# Patient Record
Sex: Female | Born: 1970 | Hispanic: Yes | Marital: Married | Smoking: Never smoker
Health system: Southern US, Community
[De-identification: ages and names within clinical notes are randomized; demographics above are authoritative.]

## PROBLEM LIST (undated history)

## (undated) DIAGNOSIS — I10 Essential (primary) hypertension: Secondary | ICD-10-CM

## (undated) DIAGNOSIS — N6459 Other signs and symptoms in breast: Secondary | ICD-10-CM

## (undated) DIAGNOSIS — K219 Gastro-esophageal reflux disease without esophagitis: Secondary | ICD-10-CM

## (undated) HISTORY — DX: Gastro-esophageal reflux disease without esophagitis: K21.9

## (undated) HISTORY — PX: TUBAL LIGATION: SHX77

---

## 1992-11-08 HISTORY — PX: ECTOPIC PREGNANCY SURGERY: SHX613

## 2005-02-04 ENCOUNTER — Ambulatory Visit (HOSPITAL_COMMUNITY): Admission: RE | Admit: 2005-02-04 | Discharge: 2005-02-04 | Payer: Self-pay | Admitting: Gynecology

## 2006-07-31 ENCOUNTER — Ambulatory Visit (HOSPITAL_COMMUNITY): Admission: RE | Admit: 2006-07-31 | Discharge: 2006-07-31 | Payer: Self-pay | Admitting: Gynecology

## 2006-11-07 ENCOUNTER — Emergency Department (HOSPITAL_COMMUNITY): Admission: EM | Admit: 2006-11-07 | Discharge: 2006-11-07 | Payer: Self-pay | Admitting: Emergency Medicine

## 2007-04-18 ENCOUNTER — Inpatient Hospital Stay (HOSPITAL_COMMUNITY): Admission: AD | Admit: 2007-04-18 | Discharge: 2007-04-18 | Payer: Self-pay | Admitting: Family Medicine

## 2007-05-11 ENCOUNTER — Inpatient Hospital Stay (HOSPITAL_COMMUNITY): Admission: AD | Admit: 2007-05-11 | Discharge: 2007-05-11 | Payer: Self-pay | Admitting: Gynecology

## 2007-07-12 ENCOUNTER — Ambulatory Visit (HOSPITAL_COMMUNITY): Admission: RE | Admit: 2007-07-12 | Discharge: 2007-07-12 | Payer: Self-pay | Admitting: Obstetrics & Gynecology

## 2007-11-21 ENCOUNTER — Inpatient Hospital Stay (HOSPITAL_COMMUNITY): Admission: AD | Admit: 2007-11-21 | Discharge: 2007-11-24 | Payer: Self-pay | Admitting: Obstetrics & Gynecology

## 2007-11-21 ENCOUNTER — Ambulatory Visit: Payer: Self-pay | Admitting: Obstetrics & Gynecology

## 2007-11-21 ENCOUNTER — Encounter: Payer: Self-pay | Admitting: Obstetrics & Gynecology

## 2007-11-29 ENCOUNTER — Inpatient Hospital Stay (HOSPITAL_COMMUNITY): Admission: AD | Admit: 2007-11-29 | Discharge: 2007-11-29 | Payer: Self-pay | Admitting: Obstetrics & Gynecology

## 2007-11-30 ENCOUNTER — Ambulatory Visit: Payer: Self-pay | Admitting: Obstetrics and Gynecology

## 2008-11-26 IMAGING — US US OB TRANSVAGINAL MODIFY
1 series · 14 of 28 positions shown · non-contrast
Comparison: none

CLINICAL DATA: Abdominal pain, early pregnancy.
 OBSTETRICAL ULTRASOUND <14 WKS AND TRANSVAGINAL OB US:
TECHNIQUE: Both transabdominal and transvaginal ultrasound examinations were performed for complete evaluation of the gestation as well as the maternal uterus, adnexal regions, and pelvic cul-de-sac.

[Series 1: us ob transvaginal modify · 0.32mm/px · 14 of 32 slices shown]
[im 2/32]
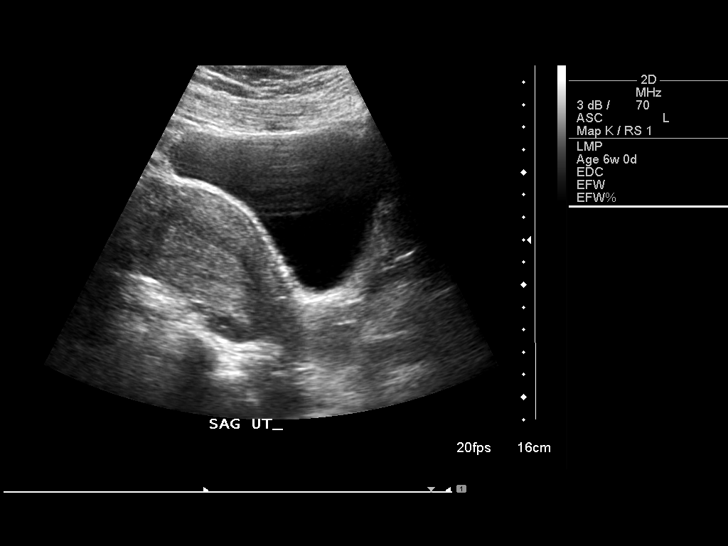
[im 4/32]
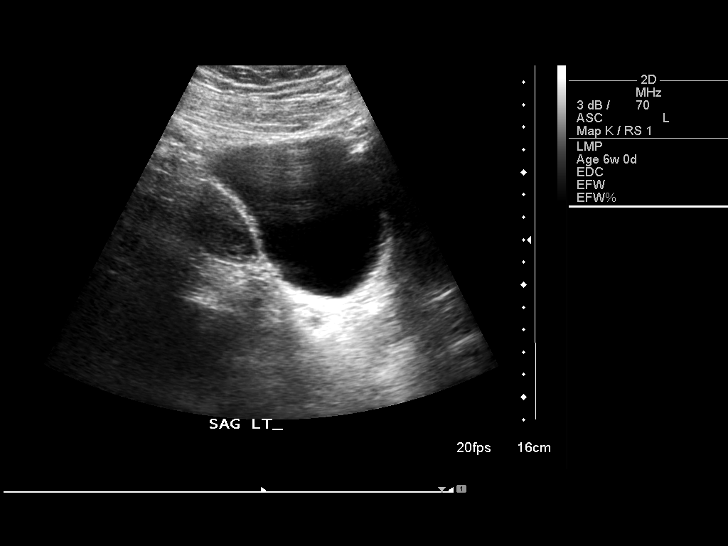
[im 6/32]
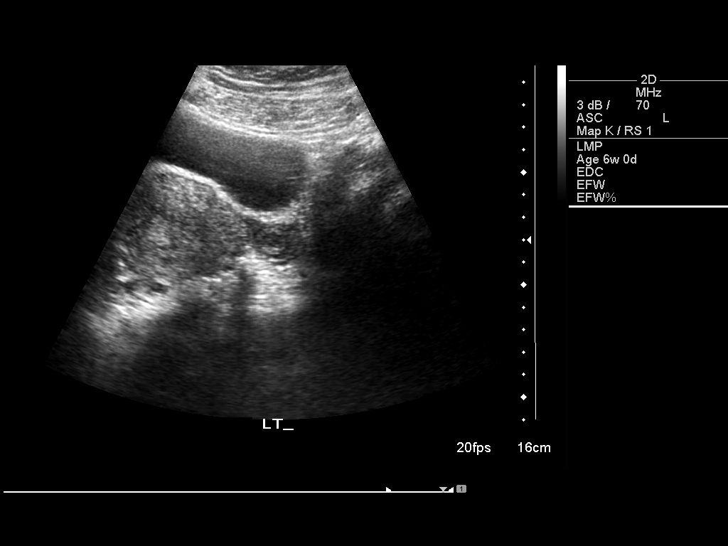
[im 9/32]
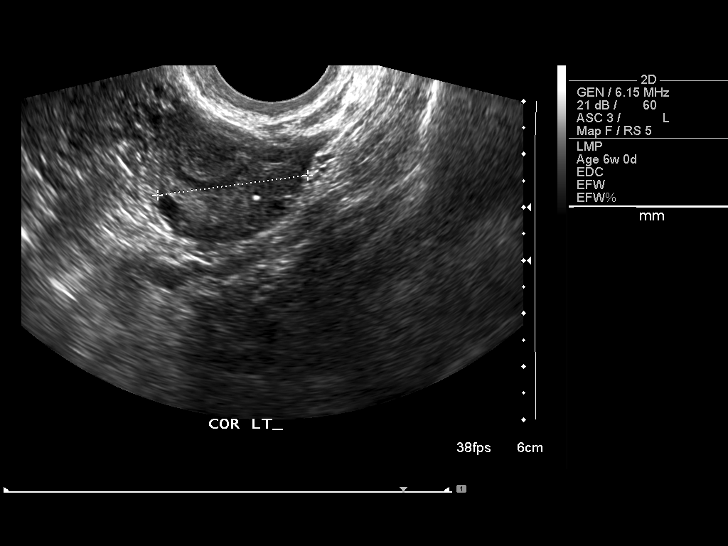
[im 11/32]
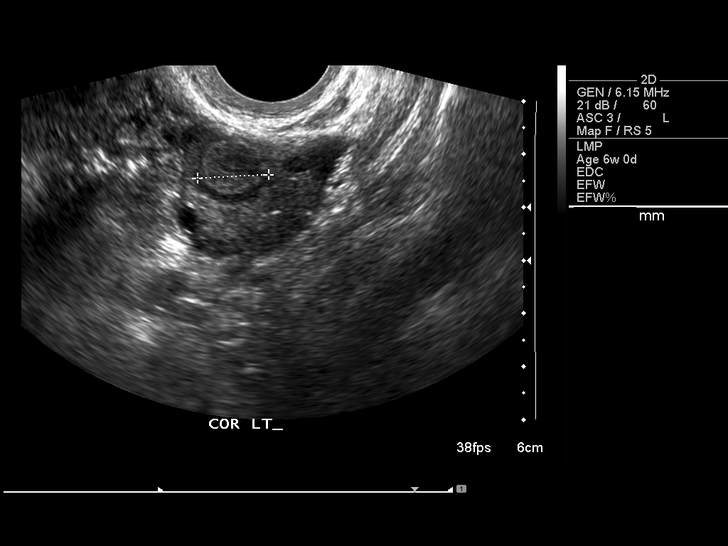
[im 13/32]
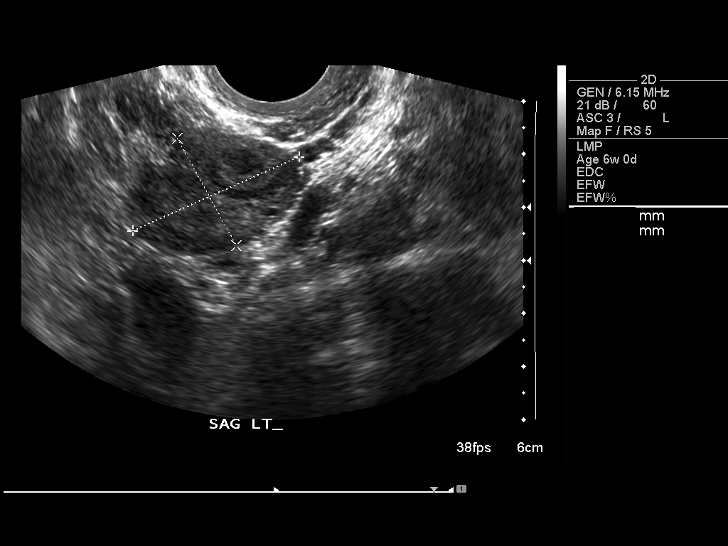
[im 15/32]
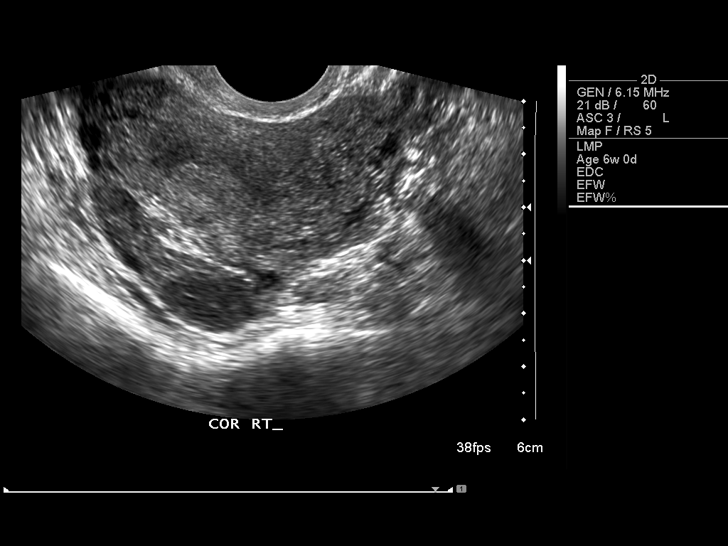
[im 18/32]
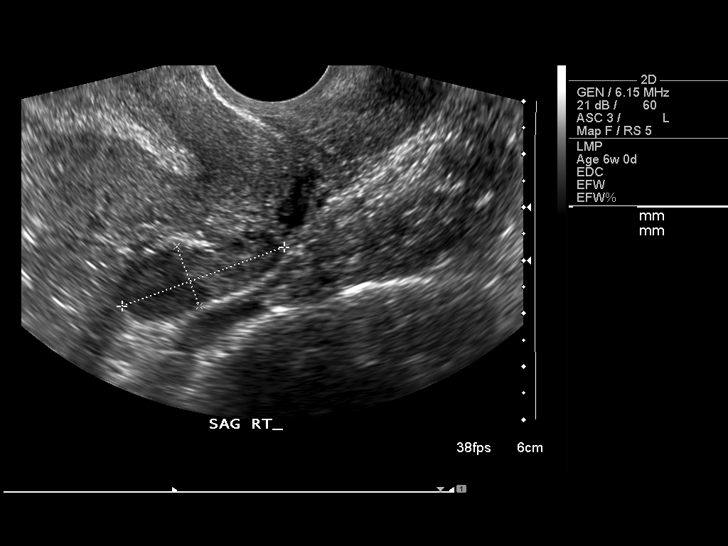
[im 20/32]
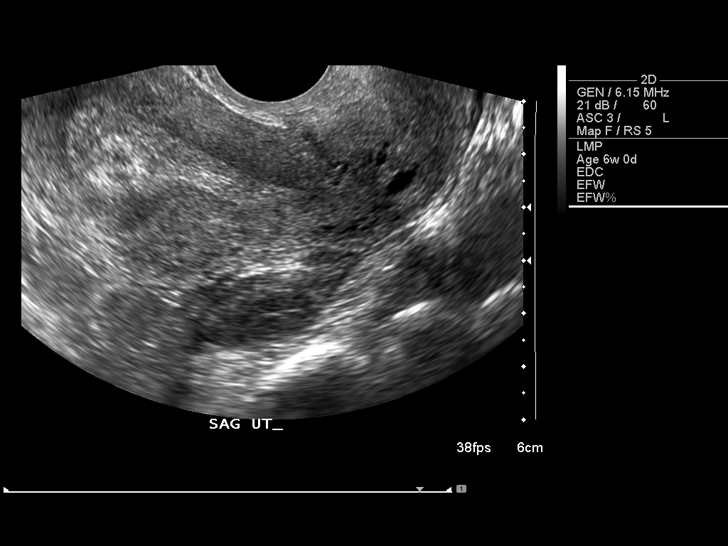
[im 22/32]
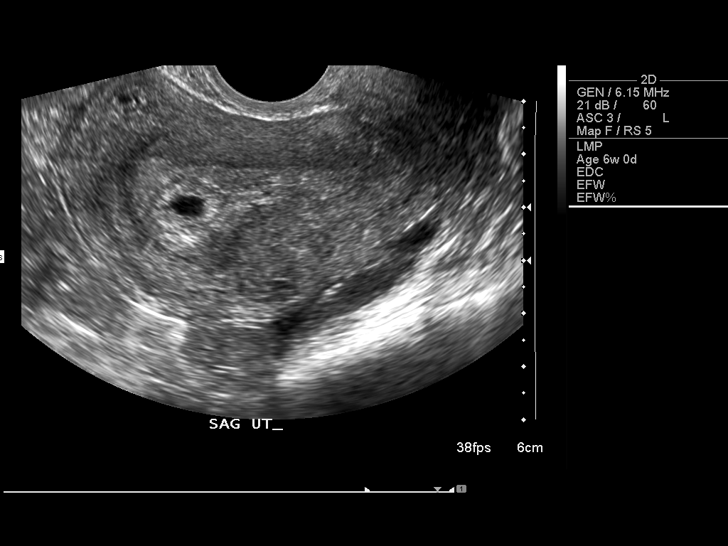
[im 25/32]
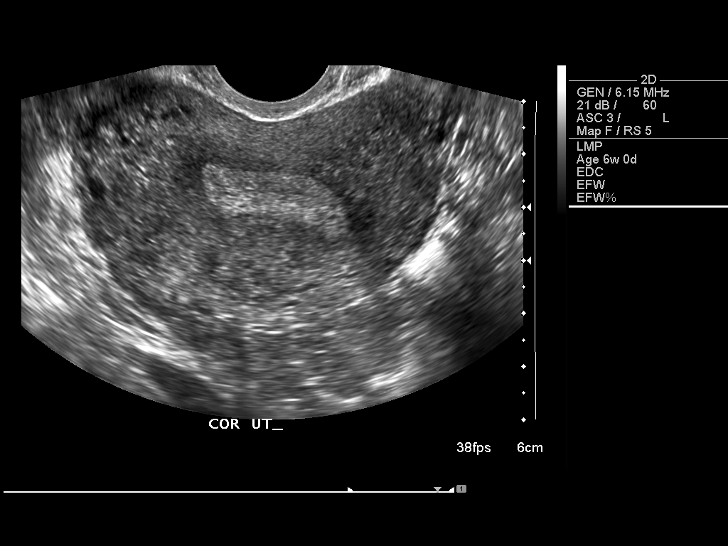
[im 27/32]
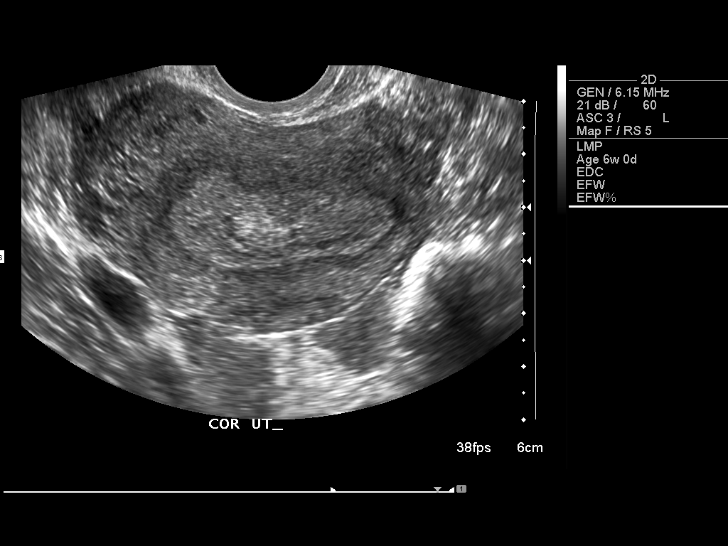
[im 29/32]
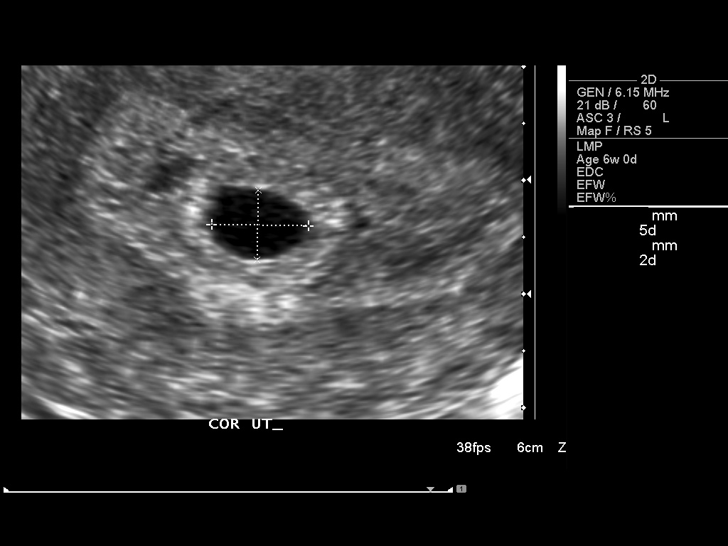
[im 32/32]
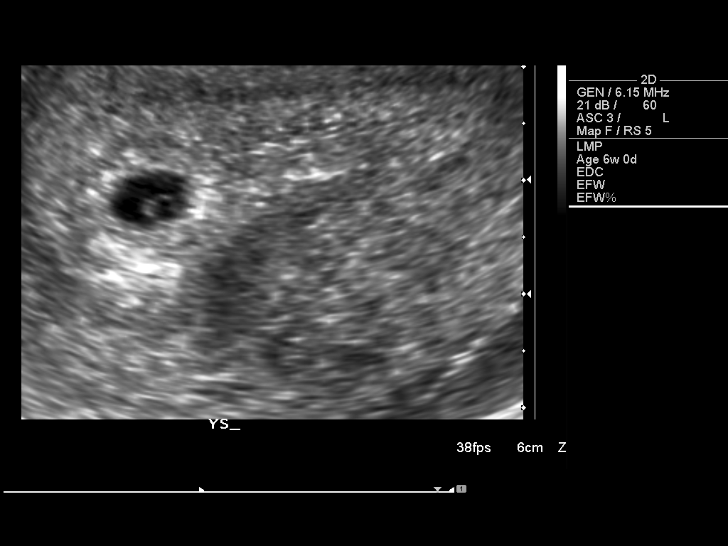

[14 of 28 positions shown; findings below may reference images not displayed]

FINDINGS: Gestational sac seen within uterus, containing a small yolk sac.  
 Fetal pole not identified.  
 Mean sac diameter 7.4 mm corresponding to gestational age of 5 weeks 3 days and an EDC of 12/16/07.
 No subchorionic hemorrhage or endometrial fluid.  
 Right ovary normal size and morphology, 2.0 x 3.3 x 1.2 cm.  
 Left ovary measures 3.4 x 2.3 x 2.9 cm and contains a small hemorrhagic corpus luteal cyst 1.3 cm diameter.  
 No adnexal masses or free pelvic fluid.
IMPRESSION: 1.  Early intrauterine gestation measuring 5 weeks 3 days estimated gestational age by mean sac diameter.  
 2.  Single yolk sac visualized without identification of fetal pole.  
 3.  Viability can be established by follow-up ultrasound in 7-10 days.

## 2010-03-13 ENCOUNTER — Ambulatory Visit: Payer: Self-pay | Admitting: Family Medicine

## 2010-04-08 ENCOUNTER — Inpatient Hospital Stay (HOSPITAL_COMMUNITY): Admission: AD | Admit: 2010-04-08 | Discharge: 2010-04-08 | Payer: Self-pay | Admitting: Obstetrics & Gynecology

## 2010-04-28 ENCOUNTER — Ambulatory Visit: Payer: Self-pay | Admitting: Obstetrics and Gynecology

## 2010-04-28 ENCOUNTER — Inpatient Hospital Stay (HOSPITAL_COMMUNITY): Admission: AD | Admit: 2010-04-28 | Discharge: 2010-04-28 | Payer: Self-pay | Admitting: Obstetrics & Gynecology

## 2010-07-31 ENCOUNTER — Ambulatory Visit (HOSPITAL_COMMUNITY)
Admission: RE | Admit: 2010-07-31 | Discharge: 2010-07-31 | Payer: Self-pay | Source: Home / Self Care | Admitting: Obstetrics & Gynecology

## 2010-09-21 ENCOUNTER — Ambulatory Visit (HOSPITAL_COMMUNITY): Admission: RE | Admit: 2010-09-21 | Discharge: 2010-09-21 | Payer: Self-pay | Admitting: Family Medicine

## 2010-10-30 ENCOUNTER — Inpatient Hospital Stay (HOSPITAL_COMMUNITY)
Admission: AD | Admit: 2010-10-30 | Discharge: 2010-10-30 | Payer: Self-pay | Source: Home / Self Care | Attending: Obstetrics and Gynecology | Admitting: Obstetrics and Gynecology

## 2010-11-01 ENCOUNTER — Inpatient Hospital Stay (HOSPITAL_COMMUNITY)
Admission: AD | Admit: 2010-11-01 | Discharge: 2010-11-03 | Payer: Self-pay | Source: Home / Self Care | Attending: Obstetrics & Gynecology | Admitting: Obstetrics & Gynecology

## 2010-11-05 ENCOUNTER — Ambulatory Visit
Admission: RE | Admit: 2010-11-05 | Discharge: 2010-11-05 | Payer: Self-pay | Source: Home / Self Care | Attending: Obstetrics and Gynecology | Admitting: Obstetrics and Gynecology

## 2010-11-05 ENCOUNTER — Encounter: Payer: Self-pay | Admitting: Obstetrics & Gynecology

## 2010-11-05 LAB — CONVERTED CEMR LAB
ALT: 28 units/L (ref 0–35)
AST: 32 units/L (ref 0–37)
Albumin: 3.7 g/dL (ref 3.5–5.2)
Alkaline Phosphatase: 169 units/L — ABNORMAL HIGH (ref 39–117)
BUN: 12 mg/dL (ref 6–23)
CO2: 20 meq/L (ref 19–32)
Calcium: 9.2 mg/dL (ref 8.4–10.5)
Chloride: 105 meq/L (ref 96–112)
Collection Interval-CRCL: 24 hr
Creatinine 24 HR UR: 1945 mg/24hr — ABNORMAL HIGH (ref 700–1800)
Creatinine Clearance: 287 mL/min — ABNORMAL HIGH (ref 75–115)
Creatinine, Ser: 0.47 mg/dL (ref 0.40–1.20)
Creatinine, Urine: 36 mg/dL
Glucose, Bld: 71 mg/dL (ref 70–99)
HCT: 41.2 % (ref 36.0–46.0)
Hemoglobin: 14.1 g/dL (ref 12.0–15.0)
MCHC: 34.2 g/dL (ref 30.0–36.0)
MCV: 87.1 fL (ref 78.0–100.0)
Platelets: 216 10*3/uL (ref 150–400)
Potassium: 4.7 meq/L (ref 3.5–5.3)
Protein, 24H Urine: 378 mg/24hr — ABNORMAL HIGH (ref 50–100)
RBC: 4.73 M/uL (ref 3.87–5.11)
RDW: 13.3 % (ref 11.5–15.5)
Sodium: 137 meq/L (ref 135–145)
Total Bilirubin: 0.2 mg/dL — ABNORMAL LOW (ref 0.3–1.2)
Total Protein: 7.3 g/dL (ref 6.0–8.3)
Uric Acid, Serum: 6 mg/dL (ref 2.4–7.0)
WBC: 8.5 10*3/uL (ref 4.0–10.5)

## 2010-11-06 ENCOUNTER — Encounter: Payer: Self-pay | Admitting: Obstetrics & Gynecology

## 2010-11-06 LAB — CONVERTED CEMR LAB
Chlamydia, DNA Probe: NEGATIVE
GC Probe Amp, Genital: NEGATIVE

## 2010-11-10 ENCOUNTER — Inpatient Hospital Stay (HOSPITAL_COMMUNITY)
Admission: AD | Admit: 2010-11-10 | Discharge: 2010-11-15 | Payer: Self-pay | Source: Home / Self Care | Attending: Obstetrics & Gynecology | Admitting: Obstetrics & Gynecology

## 2010-11-10 ENCOUNTER — Ambulatory Visit
Admission: RE | Admit: 2010-11-10 | Discharge: 2010-11-10 | Payer: Self-pay | Source: Home / Self Care | Attending: Obstetrics & Gynecology | Admitting: Obstetrics & Gynecology

## 2010-11-12 ENCOUNTER — Inpatient Hospital Stay: Admission: RE | Admit: 2010-11-12 | Payer: Self-pay | Source: Home / Self Care | Admitting: Obstetrics & Gynecology

## 2010-11-12 ENCOUNTER — Ambulatory Visit: Admit: 2010-11-12 | Payer: Self-pay | Admitting: Obstetrics and Gynecology

## 2010-11-12 LAB — COMPREHENSIVE METABOLIC PANEL
ALT: 18 U/L (ref 0–35)
AST: 25 U/L (ref 0–37)
Albumin: 2.8 g/dL — ABNORMAL LOW (ref 3.5–5.2)
Alkaline Phosphatase: 157 U/L — ABNORMAL HIGH (ref 39–117)
BUN: 13 mg/dL (ref 6–23)
CO2: 21 mEq/L (ref 19–32)
Calcium: 9.3 mg/dL (ref 8.4–10.5)
Chloride: 107 mEq/L (ref 96–112)
Creatinine, Ser: 0.51 mg/dL (ref 0.4–1.2)
GFR calc Af Amer: >60 mL/min (ref >60)
GFR calc non Af Amer: >60 mL/min (ref >60)
Glucose, Bld: 74 mg/dL (ref 70–99)
Potassium: 4.4 mEq/L (ref 3.5–5.1)
Sodium: 136 mEq/L (ref 135–145)
Total Bilirubin: 0.3 mg/dL (ref 0.3–1.2)
Total Protein: 6.4 g/dL (ref 6.0–8.3)

## 2010-11-12 LAB — CBC
HCT: 38.2 % (ref 36.0–46.0)
Hemoglobin: 13.2 g/dL (ref 12.0–15.0)
MCH: 29.7 pg (ref 26.0–34.0)
MCHC: 34.6 g/dL (ref 30.0–36.0)
MCV: 85.8 fL (ref 78.0–100.0)
Platelets: 180 10*3/uL (ref 150–400)
RBC: 4.45 MIL/uL (ref 3.87–5.11)
RDW: 13.3 % (ref 11.5–15.5)
WBC: 7.9 10*3/uL (ref 4.0–10.5)

## 2010-11-12 LAB — TYPE AND SCREEN
ABO/RH(D): O POS
Antibody Screen: NEGATIVE

## 2010-11-13 LAB — COMPREHENSIVE METABOLIC PANEL
ALT: 19 U/L (ref 0–35)
AST: 33 U/L (ref 0–37)
Albumin: 2.5 g/dL — ABNORMAL LOW (ref 3.5–5.2)
Alkaline Phosphatase: 126 U/L — ABNORMAL HIGH (ref 39–117)
BUN: 8 mg/dL (ref 6–23)
CO2: 24 mEq/L (ref 19–32)
Calcium: 7.2 mg/dL — ABNORMAL LOW (ref 8.4–10.5)
Chloride: 102 mEq/L (ref 96–112)
Creatinine, Ser: 0.62 mg/dL (ref 0.4–1.2)
GFR calc Af Amer: >60 mL/min (ref >60)
GFR calc non Af Amer: >60 mL/min (ref >60)
Glucose, Bld: 93 mg/dL (ref 70–99)
Potassium: 4.1 mEq/L (ref 3.5–5.1)
Sodium: 132 mEq/L — ABNORMAL LOW (ref 135–145)
Total Bilirubin: 0.4 mg/dL (ref 0.3–1.2)
Total Protein: 6.1 g/dL (ref 6.0–8.3)

## 2010-11-13 LAB — CBC
HCT: 34.5 % — ABNORMAL LOW (ref 36.0–46.0)
Hemoglobin: 11.7 g/dL — ABNORMAL LOW (ref 12.0–15.0)
MCH: 29.3 pg (ref 26.0–34.0)
MCHC: 33.9 g/dL (ref 30.0–36.0)
MCV: 86.5 fL (ref 78.0–100.0)
Platelets: 160 10*3/uL (ref 150–400)
RBC: 3.99 MIL/uL (ref 3.87–5.11)
RDW: 13.6 % (ref 11.5–15.5)
WBC: 8.4 10*3/uL (ref 4.0–10.5)

## 2010-11-13 LAB — MAGNESIUM: Magnesium: 4.9 mg/dL — ABNORMAL HIGH (ref 1.5–2.5)

## 2010-11-13 LAB — MRSA PCR SCREENING: MRSA by PCR: NEGATIVE

## 2010-11-16 ENCOUNTER — Ambulatory Visit: Admit: 2010-11-16 | Payer: Self-pay | Admitting: Obstetrics & Gynecology

## 2010-11-19 ENCOUNTER — Ambulatory Visit: Admit: 2010-11-19 | Payer: Self-pay | Admitting: Obstetrics and Gynecology

## 2010-11-23 LAB — RPR: RPR Ser Ql: NONREACTIVE

## 2010-11-29 ENCOUNTER — Encounter: Payer: Self-pay | Admitting: Gynecology

## 2010-12-03 NOTE — Op Note (Addendum)
[name], [name]      ACCOUNT NO.:  [id number]  MEDICAL RECORD NO.:  [id number]          PATIENT TYPE:  WOC  LOCATION:  WOC                          FACILITY:  [facility]  PHYSICIAN:  Allie Bossier, MD        DATE OF BIRTH:  May 09, 1971  DATE OF PROCEDURE:  11/12/2010 DATE OF DISCHARGE:                              OPERATIVE REPORT   PREOPERATIVE DIAGNOSES:  Previous cesarean section x2, desires sterility, 37 weeks' estimated gestational age, preeclampsia.  POSTOPERATIVE DIAGNOSES:  Previous cesarean section x2, desires sterility, 37 weeks' estimated gestational age, preeclampsia.  PROCEDURE:  Repeat low transverse cesarean section and postpartum sterilization (Filshie clips).  SURGEON:  Allie Bossier, MD.  ANESTHESIA:  Angelica Pou, MD.  COMPLICATIONS:  None.  ESTIMATED BLOOD LOSS:  500 mL.  SPECIMENS:  Cord blood.  FINDINGS: 1. Living female infant, Apgars 9 and at 1 and 5 minutes, weight 5     pounds 4 ounces. 2. Normal adnexa and placenta. 3. Dense adhesions.  DETAIL PROCEDURE AND FINDINGS:  The risks, benefits, and alternatives of surgery were explained, understood, and accepted.  Consents were signed in the operating room.  The spinal anesthesia was applied without complication.  With the help of the interpreter, I discussed the type of incision she would like, she has a vertical scar from her two previous C- sections and I offered to repeat a vertical or proceed with a primary low transverse incision.  She chose the transverse incision.  A Foley catheter was placed with the prepping and draping and it drained clear urine throughout this case.  After adequate anesthesia was assured, I made a transverse incision and the incision was carried down through the subcutaneous tissue to the fascia, bleeding encountered was cauterized with Bovie.  The fascia was scored in midline, fascial incision was extended bilaterally, curved slightly upward.  The middle 50%  of the rectus muscles were separated in transverse fashion using electrosurgical technique.  Excellent hemostasis was noted.  I entered the densely scarred peritoneum.  The omentum was densely adherent to the peritoneum.  Eventually, I was able to tease an opening, it was a relatively small opening because of the omental adhesions.  I placed a bladder blade and made a transverse incision on the poorly developed lower uterine segment.  Amniotomy was performed with hemostats.  Clear fluid was noted.  The uterine incision was extended with bandage scissors and curved slightly upwards.  I did require the assistance of a Kiwi vacuum extractor, used when the pressure was in the green zone for one pull to aid with the delivery of the head.  Once the head was delivered, the mouth and nostrils were suctioned.  The cord was clamped and cut and the baby was transferred to the pediatrics personnel for routine care with weight and Apgars as listed above.  Cord blood samples obtained.  The placenta was extracted with traction.  The uterus was exteriorized and cleaned anteriorly with a dry lap sponge.  It was wrapped exteriorly with a dry lap sponge and the uterine incision was closed with 2-0 Vicryl suture in a running locking fashion.  I did need 2  figure-of-eight sutures in the midline to yield excellent hemostasis. I traced the tubes to the fimbriated end and placed the Filshie clip in the isthmic region of each tube.  No bleeding was noted.  I placed the uterus back in the cavity, I inspected the hysterotomy incision once again, it was hemostatic as were the rectus fascia and rectus muscle. The fascia was closed with a 0 PDS loop in a running nonlocking fashion. No defects were palpable.  The subcutaneous tissue was irrigated, cleaned, and dried.  It was then infiltrated with 30 mL of 0.5% Marcaine.  A subcuticular closure was done with 3-0 Vicryl suture. Excellent cosmetic results were obtained.   Her Foley catheter drained clear urine throughout.  The instrument, sponge, and needle counts were correct.  She tolerated the procedure well.     Allie Bossier, MD     MCD/MEDQ  D:  11/12/2010  T:  11/12/2010  Job:  [id number]  Electronically Signed by Nicholaus Bloom MD on 12/03/2010 03:44:17 PM

## 2010-12-09 ENCOUNTER — Encounter: Payer: Self-pay | Admitting: *Deleted

## 2011-01-18 LAB — CREATININE CLEARANCE, URINE, 24 HOUR
Collection Interval-CRCL: 24 hours
Collection Interval-CRCL: 24 hours
Creatinine Clearance: 172 mL/min — ABNORMAL HIGH (ref 75–115)
Creatinine Clearance: 293 mL/min — ABNORMAL HIGH (ref 75–115)
Creatinine, 24H Ur: 1089 mg/d (ref 700–1800)
Creatinine, 24H Ur: 2070 mg/d — ABNORMAL HIGH (ref 700–1800)
Creatinine, Urine: 33.5 mg/dL
Creatinine: 0.44 mg/dL (ref 0.4–1.2)

## 2011-01-18 LAB — COMPREHENSIVE METABOLIC PANEL
ALT: 18 U/L (ref 0–35)
ALT: 19 U/L (ref 0–35)
ALT: 21 U/L (ref 0–35)
ALT: 25 U/L (ref 0–35)
AST: 22 U/L (ref 0–37)
AST: 24 U/L (ref 0–37)
Albumin: 2.6 g/dL — ABNORMAL LOW (ref 3.5–5.2)
Alkaline Phosphatase: 132 U/L — ABNORMAL HIGH (ref 39–117)
Alkaline Phosphatase: 140 U/L — ABNORMAL HIGH (ref 39–117)
Alkaline Phosphatase: 147 U/L — ABNORMAL HIGH (ref 39–117)
BUN: 10 mg/dL (ref 6–23)
BUN: 14 mg/dL (ref 6–23)
CO2: 20 mEq/L (ref 19–32)
CO2: 21 mEq/L (ref 19–32)
CO2: 22 mEq/L (ref 19–32)
CO2: 24 mEq/L (ref 19–32)
Calcium: 9 mg/dL (ref 8.4–10.5)
Chloride: 104 mEq/L (ref 96–112)
Chloride: 104 mEq/L (ref 96–112)
Chloride: 106 mEq/L (ref 96–112)
Chloride: 107 mEq/L (ref 96–112)
Creatinine, Ser: 0.44 mg/dL (ref 0.4–1.2)
Creatinine, Ser: 0.48 mg/dL (ref 0.4–1.2)
Creatinine, Ser: 0.77 mg/dL (ref 0.4–1.2)
GFR calc Af Amer: >60 mL/min (ref >60)
GFR calc Af Amer: >60 mL/min (ref >60)
GFR calc non Af Amer: >60 mL/min (ref >60)
GFR calc non Af Amer: >60 mL/min (ref >60)
GFR calc non Af Amer: >60 mL/min (ref >60)
GFR calc non Af Amer: >60 mL/min (ref >60)
Glucose, Bld: 103 mg/dL — ABNORMAL HIGH (ref 70–99)
Glucose, Bld: 88 mg/dL (ref 70–99)
Glucose, Bld: 89 mg/dL (ref 70–99)
Potassium: 4 mEq/L (ref 3.5–5.1)
Potassium: 4.1 mEq/L (ref 3.5–5.1)
Potassium: 4.4 mEq/L (ref 3.5–5.1)
Sodium: 132 mEq/L — ABNORMAL LOW (ref 135–145)
Sodium: 133 mEq/L — ABNORMAL LOW (ref 135–145)
Sodium: 133 mEq/L — ABNORMAL LOW (ref 135–145)
Total Bilirubin: 0.4 mg/dL (ref 0.3–1.2)
Total Bilirubin: 0.4 mg/dL (ref 0.3–1.2)
Total Bilirubin: 0.4 mg/dL (ref 0.3–1.2)
Total Protein: 6.2 g/dL (ref 6.0–8.3)

## 2011-01-18 LAB — URINALYSIS, ROUTINE W REFLEX MICROSCOPIC
Bilirubin Urine: NEGATIVE
Hgb urine dipstick: NEGATIVE
Ketones, ur: NEGATIVE mg/dL
Ketones, ur: NEGATIVE mg/dL
Leukocytes, UA: NEGATIVE
Nitrite: NEGATIVE
Specific Gravity, Urine: <1.005 — ABNORMAL LOW (ref 1.005–1.030)
Urobilinogen, UA: 0.2 mg/dL (ref 0.0–1.0)
Urobilinogen, UA: 0.2 mg/dL (ref 0.0–1.0)
pH: 6 (ref 5.0–8.0)

## 2011-01-18 LAB — CBC
HCT: 37 % (ref 36.0–46.0)
HCT: 37.8 % (ref 36.0–46.0)
HCT: 37.9 % (ref 36.0–46.0)
HCT: 38.3 % (ref 36.0–46.0)
Hemoglobin: 12.6 g/dL (ref 12.0–15.0)
Hemoglobin: 12.9 g/dL (ref 12.0–15.0)
Hemoglobin: 13.2 g/dL (ref 12.0–15.0)
Hemoglobin: 13.2 g/dL (ref 12.0–15.0)
MCH: 29.3 pg (ref 26.0–34.0)
MCH: 29.5 pg (ref 26.0–34.0)
MCH: 29.9 pg (ref 26.0–34.0)
MCHC: 34.1 g/dL (ref 30.0–36.0)
MCHC: 34.8 g/dL (ref 30.0–36.0)
MCV: 85.9 fL (ref 78.0–100.0)
MCV: 86.2 fL (ref 78.0–100.0)
Platelets: 181 10*3/uL (ref 150–400)
Platelets: 190 10*3/uL (ref 150–400)
RBC: 4.14 MIL/uL (ref 3.87–5.11)
RBC: 4.4 MIL/uL (ref 3.87–5.11)
RBC: 4.48 MIL/uL (ref 3.87–5.11)
RDW: 13.4 % (ref 11.5–15.5)
RDW: 13.4 % (ref 11.5–15.5)
WBC: 10.3 10*3/uL (ref 4.0–10.5)
WBC: 9.7 10*3/uL (ref 4.0–10.5)

## 2011-01-18 LAB — POCT URINALYSIS DIPSTICK
Bilirubin Urine: NEGATIVE
Nitrite: NEGATIVE
Protein, ur: NEGATIVE mg/dL
Urobilinogen, UA: 0.2 mg/dL (ref 0.0–1.0)
pH: 6.5 (ref 5.0–8.0)

## 2011-01-18 LAB — STREP B DNA PROBE: Strep Group B Ag: NEGATIVE

## 2011-01-18 LAB — PROTEIN / CREATININE RATIO, URINE
Creatinine, Urine: 50.9 mg/dL
Total Protein, Urine: 7 mg/dL

## 2011-01-18 LAB — PROTEIN, URINE, 24 HOUR
Collection Interval-UPROT: 24 hours
Protein, Urine: 4 mg/dL
Urine Total Volume-UPROT: 6350 mL

## 2011-01-18 LAB — CREATININE, SERUM: GFR calc non Af Amer: >60 mL/min (ref >60)

## 2011-01-24 LAB — URINALYSIS, ROUTINE W REFLEX MICROSCOPIC
Bilirubin Urine: NEGATIVE
Ketones, ur: NEGATIVE mg/dL
Nitrite: NEGATIVE
Protein, ur: NEGATIVE mg/dL
Urobilinogen, UA: 0.2 mg/dL (ref 0.0–1.0)

## 2011-01-25 LAB — URINALYSIS, ROUTINE W REFLEX MICROSCOPIC
Bilirubin Urine: NEGATIVE
Hgb urine dipstick: NEGATIVE
Protein, ur: NEGATIVE mg/dL
Urobilinogen, UA: 0.2 mg/dL (ref 0.0–1.0)

## 2011-01-25 LAB — CBC
MCHC: 34.1 g/dL (ref 30.0–36.0)
Platelets: 220 10*3/uL (ref 150–400)
RDW: 13.2 % (ref 11.5–15.5)

## 2011-01-25 LAB — HCG, QUANTITATIVE, PREGNANCY: hCG, Beta Chain, Quant, S: 32483 m[IU]/mL — ABNORMAL HIGH (ref <5)

## 2011-01-25 LAB — ABO/RH: ABO/RH(D): O POS

## 2011-01-25 LAB — WET PREP, GENITAL: Yeast Wet Prep HPF POC: NONE SEEN

## 2011-03-23 NOTE — Discharge Summary (Signed)
NAME:  [name], [name]         ACCOUNT NO.:  [id number]   MEDICAL RECORD NO.:  [id number]          PATIENT TYPE:  INP   LOCATION:  9104                          [facility]   PHYSICIAN:  Phil D. Okey Dupre, M.D.     DATE OF BIRTH:  [birth year]   DATE OF ADMISSION:  11/21/2007  DATE OF DISCHARGE:  11/24/2007                               DISCHARGE SUMMARY   ATTENDING PHYSICIAN:  Phil D. Okey Dupre, M.D.   ADMISSION DIAGNOSES:  1. Intrauterine pregnancy at 37 weeks.  2. Gestational hypertension.  3. Previous cesarean section with unknown incision type.   DISCHARGE DIAGNOSES:  1. Postoperative anemia.  2. Repeat low-transverse cesarean section with vertical skin      incisions.  3. Gestational hypertension, resolved.   PROCEDURES:  The patient had a repeat low-transverse cesarean section  with vertical skin incision and lysis of adhesions performed by Norton Blizzard, M.D. on November 21, 2007.   COMPLICATIONS:  None.   CONSULTATIONS:  None.   PERTINENT LABORATORY FINDINGS:  The patient had an admission CBC:  White  blood cell count 10.9, hemoglobin 12.5, platelets 225.  RPR was  nonreactive.  On postoperative day #1, white blood cell count was 11.5,  hemoglobin was 11, platelets 185.  She had reported normal PIH labs  prior to admission to the hospital.   BRIEF ADMISSION HISTORY:  This is a 40 year old gravida 3, para 1-0-1-1  at 53 weeks' gestation who presents with elevated blood pressures.  She  was found to have normal PIH labs but had symptoms of headache and  significant of hyperreflexia with blood pressure 150s over 100s.  Her  previous C-section was an unknown incision type.  Due to this, the  patient was taken for repeat C-section due to her risk of previous  vertical uterine incision.   HOSPITAL COURSE:  The patient was admitted and taken for repeat C-  section performed Norton Blizzard, M.D. on November 21, 2007.  A repeat  low-transverse cesarean section through a  vertical skin incision was  performed with lysis of adhesions.  The patient delivered a viable  infant, female, weighing 5 pounds 7 ounces with Apgar's of 9 at one minute  and 9 at five minutes.  She had an uncomplicated postpartum course with  a postpartum hemoglobin of 11.  Her blood pressure at the time of  discharge had resolved to the range of 109 to 124 systolic over 66 to 81  diastolic.  Her vaginal bleeding had slowed down and she had a normal  physical examination at the time of discharge.  She was to be discharged  home in stable condition.   DISCHARGE STATUS:  Stable.   DISCHARGE MEDICATIONS:  1. Colace 100 mg p.o. b.i.d.  2. Motrin 600 mg one tablet every 6 hours as needed for pain.  3. Percocet 5/325 one tablet every 6 hours as needed for pain.  4. Prenatal vitamins 1 tablet by mouth daily.   DISCHARGE INSTRUCTIONS:  1. Discharge to home.  2. No sexual activity or lifting greater than 15 pounds for 6 weeks.  3. Regular diet.  4. Baby Luv to remove her staples on postoperative day #7.  5. The patient is to follow up in 6 weeks for postpartum examination      at the [facility].      Karlton Lemon, MD  Electronically Signed     ______________________________  Javier Glazier. Okey Dupre, M.D.    NS/MEDQ  D:  11/24/2007  T:  11/24/2007  Job:  [id number]

## 2011-03-23 NOTE — Op Note (Signed)
NAMEEddie Candle, MA         ACCOUNT NO.:  [id number]   MEDICAL RECORD NO.:  [id number]          PATIENT TYPE:  INP   LOCATION:  9199                          [facility]   PHYSICIAN:  Norton Blizzard, MD    DATE OF BIRTH:  November 30, 1970   DATE OF PROCEDURE:  11/21/2007  DATE OF DISCHARGE:                               OPERATIVE REPORT   PREOPERATIVE DIAGNOSIS:  Gestational hypertension at term, prior  cesarean section without known incision type.   POSTOPERATIVE DIAGNOSIS:  Gestational hypertension at term, prior  cesarean section without known incision type.   PROCEDURE:  Repeat cesarean section, lysis of adhesions via vertical  incision.   SURGEON:  Norton Blizzard, M.D.   ANESTHESIA:  Spinal.   FLUIDS REPLACED:  2100 mL lactated ringers.   ESTIMATED BLOOD LOSS:  800 mL.   URINE OUTPUT:  200 mL.   INDICATIONS FOR PROCEDURE:  The patient is a 40 year old G3, P1-0-1-1,  at 37 weeks by LMP consistent with early ultrasound, who presented with  elevated blood pressures over the last week.  On January 9, she had  normal PIH lab panel and her 24 hour urine protein returned with 60 mg  of protein.  The plan was made for the patient to follow up in clinic  today. Upon arrival, the patient was noted to have blood pressures in  the high 150s and 100s and was complaining of a headache.  She was also  noted to be hyper-reflexic.  She was diagnosed with elevated GHTN at  term, sothe decision was made to get an NST which was reactive and to  move towards delivery.  The patient has a history of a prior cesarean  section in [place], but was unable to obtain reports of this cesarean  section and was unsure of her uterine incision type.  Given this, and  also given that her cervical exam was closed and long, the decision was  made to proceed with repeat cesarean section.  The risks of cesarean  section were discussed with the patient and written informed consent was  obtained with  the aid of a Spanish interpreter.   FINDINGS:  Viable female infant in cephalic presentation.  Of note, there  was some difficulty in delivering the fetal head.  This was alleviated  by using a vacuum to assist during delivery.  Apgars were 9 and 9.  Weight 5 pounds 7 ounces.  Spontaneous placental delivery intact with  three vessel cord.  Normal uterus, tubes, and ovaries on palpation.  Unable to exteriorize uterus due to multiple adhesions of the omentum to  the overlying peritoneum and also the bladder reflection; these were  taken down by careful transection and ligation.  Overall, good  hemostasis was noted.   COMPLICATIONS:  None.   PROCEDURE IN DETAIL:  The patient was taken to the operating room where  spinal anesthesia was placed and found to be adequate.  She was then  placed in the dorsal supine position with a leftward tilt and prepped  and draped in a sterile manner.  A Foley catheter was attached to her  bladder  and attached to constant gravity.  Attention was turned to the  patient's abdomen where a vertical incision was made with the scalpel  over her prior vertical incision.  This incision was carried through to  the underlying layer of fascia.  The fascia was incised and this  incision was extended superiorly and inferiorly using Mayo scissors.  The peritoneum was identified and entered sharply using Metzenbaum  scissors.  At this point, it was noted that the omentum was tightly  adherent to the peritoneum and also to the vesicouterine peritoneum.  There was not much ability to get to the uterus without going through  the omentum.  Clear areas in between the omental tissues were noted and  the omentum was serially ligated using Kelly clamps, and the omentum was  then cut and ligated with 0 Vicryl free ties.  Overall, good hemostasis  was noted.  At this point, the uterus was encountered and a low  transverse incision was made on the lower uterine segment.  The   hysterotomy was extended bluntly.  The infant's head was encountered, an  attempt was made to deliver the infant's head but this was unsuccessful  even after the hysterotomy incision was extended bilaterally, and also  the skin incision and fascial incision were all extended.  After 2-3  attempts of delivering the infant's head without success, a call was  made to provide a vacuum cup which was placed on the infant's ; the  device was activated, and this was used to aid in delivery.  The infant  was then delivered atraumatically, the cord was clamped and cut, and the  infant was handed over to the awaiting pediatricians.  Cord blood was  collected according to protocol.  The placenta was delivered  spontaneously, intact with three vessel cord.  The uterus was then  cleared of all clot and debris.  The hysterotomy was repaired with 0  Monocryl in a running locked fashion, and overall good hemostasis was  noted.  The peritoneum was reapproximated using 2-0 Vicryl in a running  stitch, the fascia was reapproximated using 0 looped PDS, the  subcutaneous layer was reapproximated using plain gut, and the skin was  closed with staples.  The patient tolerated the procedure well.  Sponge,  needle, and instrument counts were correct x2.  She was taken to the  PACU in stable condition.      Norton Blizzard, MD  Electronically Signed     UAD/MEDQ  D:  11/21/2007  T:  11/21/2007  Job:  [id number]

## 2011-07-29 LAB — CBC
HCT: 31.9 — ABNORMAL LOW
HCT: 36
Hemoglobin: 11 — ABNORMAL LOW
Hemoglobin: 12.5
MCV: 85.3
MCV: 85.6
RBC: 4.22
RDW: 12.8
WBC: 10.9 — ABNORMAL HIGH

## 2011-07-29 LAB — RPR: RPR Ser Ql: NONREACTIVE

## 2011-08-26 LAB — URINALYSIS, ROUTINE W REFLEX MICROSCOPIC
Glucose, UA: NEGATIVE
Hgb urine dipstick: NEGATIVE
Ketones, ur: NEGATIVE
pH: 7

## 2011-08-26 LAB — WET PREP, GENITAL
Clue Cells Wet Prep HPF POC: NONE SEEN
Yeast Wet Prep HPF POC: NONE SEEN

## 2011-08-26 LAB — HCG, QUANTITATIVE, PREGNANCY: hCG, Beta Chain, Quant, S: 4607 — ABNORMAL HIGH

## 2011-08-26 LAB — CBC
MCHC: 33.5
MCV: 84.5
Platelets: 268

## 2014-05-27 ENCOUNTER — Inpatient Hospital Stay (HOSPITAL_COMMUNITY)
Admission: AD | Admit: 2014-05-27 | Discharge: 2014-05-27 | Disposition: A | Discharge status: Home / Self Care | Payer: Self-pay | Source: Ambulatory Visit | Attending: Obstetrics & Gynecology | Admitting: Obstetrics & Gynecology

## 2014-05-27 ENCOUNTER — Inpatient Hospital Stay (HOSPITAL_COMMUNITY): Payer: Self-pay

## 2014-05-27 ENCOUNTER — Encounter (HOSPITAL_COMMUNITY): Payer: Self-pay | Admitting: *Deleted

## 2014-05-27 DIAGNOSIS — R109 Unspecified abdominal pain: Secondary | ICD-10-CM | POA: Insufficient documentation

## 2014-05-27 DIAGNOSIS — N949 Unspecified condition associated with female genital organs and menstrual cycle: Secondary | ICD-10-CM | POA: Insufficient documentation

## 2014-05-27 DIAGNOSIS — R21 Rash and other nonspecific skin eruption: Secondary | ICD-10-CM

## 2014-05-27 DIAGNOSIS — R102 Pelvic and perineal pain: Secondary | ICD-10-CM

## 2014-05-27 DIAGNOSIS — L299 Pruritus, unspecified: Secondary | ICD-10-CM | POA: Insufficient documentation

## 2014-05-27 HISTORY — DX: Essential (primary) hypertension: I10

## 2014-05-27 LAB — URINALYSIS, ROUTINE W REFLEX MICROSCOPIC
Bilirubin Urine: NEGATIVE
GLUCOSE, UA: NEGATIVE mg/dL
Ketones, ur: NEGATIVE mg/dL
LEUKOCYTES UA: NEGATIVE
Nitrite: NEGATIVE
PH: 5.5 (ref 5.0–8.0)
Protein, ur: NEGATIVE mg/dL
Urobilinogen, UA: 0.2 mg/dL (ref 0.0–1.0)

## 2014-05-27 LAB — POCT PREGNANCY, URINE: PREG TEST UR: NEGATIVE

## 2014-05-27 LAB — CBC
HEMATOCRIT: 39.2 % (ref 36.0–46.0)
Hemoglobin: 13.4 g/dL (ref 12.0–15.0)
MCH: 28.7 pg (ref 26.0–34.0)
MCHC: 34.2 g/dL (ref 30.0–36.0)
MCV: 83.9 fL (ref 78.0–100.0)
PLATELETS: 247 10*3/uL (ref 150–400)
RBC: 4.67 MIL/uL (ref 3.87–5.11)
RDW: 13 % (ref 11.5–15.5)
WBC: 9.1 10*3/uL (ref 4.0–10.5)

## 2014-05-27 LAB — WET PREP, GENITAL
CLUE CELLS WET PREP: NONE SEEN
TRICH WET PREP: NONE SEEN
Yeast Wet Prep HPF POC: NONE SEEN

## 2014-05-27 LAB — URINE MICROSCOPIC-ADD ON

## 2014-05-27 MED ORDER — IBUPROFEN 800 MG PO TABS: 800.0000 mg | ORAL_TABLET | Freq: Three times a day (TID) | ORAL | Status: DC

## 2014-05-27 MED ORDER — HYDROXYZINE HCL 25 MG PO TABS
25.0000 mg | ORAL_TABLET | Freq: Once | ORAL | Status: AC
  Administered 2014-05-27: 25 mg via ORAL
  Filled 2014-05-27: qty 1

## 2014-05-27 MED ORDER — HYDROXYZINE PAMOATE 25 MG PO CAPS: 25.0000 mg | ORAL_CAPSULE | Freq: Three times a day (TID) | ORAL | Status: DC | PRN

## 2014-05-27 NOTE — Discharge Instructions (Signed)
Erupcin cutnea (Rash)  Una erupcin es un cambio en el color o en la forma en que siente su piel. Hay diferentes tipos de erupcin. Puede ser que tenga otros sntomas adems de la erupcin.  CUIDADOS EN EL HOGAR  Evite lo que ha causado la erupcin.  No se rasque la lesin.  Puede tomar baos con agua fresca para detener la picazn.  Tome slo los medicamentos que le haya indicado el mdico.  Cumpla con los controles mdicos segn las indicaciones. SOLICITE AYUDA DE INMEDIATO SI:  El dolor, la inflamacin (hinchazn) o el enrojecimiento empeoran.  Tiene fiebre.  Tiene sntomas nuevos o estos empeoran.  Siente dolores en el cuerpo, tiene heces acuosas (diarrea) o vmitos.  La erupcin no mejora en el trmino de 3 das. ASEGRESE QUE:   Comprende estas instrucciones.  Controlar su enfermedad.  Solicitar ayuda inmediatamente si no mejora o si empeora. Document Released: 01/21/2009 Document Revised: 07/19/2012 [facility]Exit[facility] Patient Information 2015 [place], [place]. This information is not intended to replace advice given to you by your health care provider. Make sure you discuss any questions you have with your health care provider. Dolor plvico  (Pelvic Pain)  El dolor plvico se siente debajo del ombligo y a nivel de las caderas. Puede tener diferentes causas. Es importante recibir asistencia inmediatamente. Especialmente en los casos de dolor agudo, intenso e inusual que aparece de Olivarezmanera sbita.  CUIDADOS EN EL HOGAR   Slo tome los medicamentos segn le indique el mdico.  Haga reposo tal como le indic el mdico.  Consuma una dieta rica en frutas, vegetales y carnes [place].  Beba gran cantidad de lquido para mantener el pis (orina) de tono claro o amarillo plido.  Evite las relaciones sexuales, Counsellorsi le producen dolor.  Aplique compresas calientes o fras en la zona baja del vientre (abdomen). Aplique la compresa que Conservation officer, naturele calme el dolor.  Evite las situaciones que le  causen estrs.  Lleve un registro Programmer, systemsdel dolor. Marcelino FreestoneEscriba:  Cundo comenz el dolor.  Dnde se localiza.  Las cosas que parecen relacionarse con el dolor, como las comidas o el perodo menstrual.  Concurra a las consultas de control con el mdico, segn las indicaciones. SOLICITE AYUDA DE INMEDIATO SI:   Tiene una hemorragia por la vagina.  Siente ms dolor plvico.  Se siente dbil, tiene mareos o se desvanece (se desmaya).  Siente escalofros.  Siente dolor al orinar u observa sangre en la orina.  La materia fecal es lquida (diarrea).  No puede detener los vmitos.  Tiene fiebre o sntomas que persisten durante ms de [address]  [address].  Tiene fiebre y los sntomas [address]pe[address].  Ha sido abusada fsica o sexualmente.  Los medicamentos no Editor, commissioningle calman el dolor.  Observa lquido (secrecin) por la vagina o un lquido que no es normal. ASEGRESE DE QUE:   Comprende estas instrucciones.  Controlar su enfermedad.  Solicitar ayuda de inmediato si no mejora o si empeora. Document Released: 04/25/2012 [facility]Exit[facility] Patient Information 2015 [place]ExitCare, [place]LLC. This information is not intended to replace advice given to you by your health care provider. Make sure you discuss any questions you have with your health care provider.

## 2014-05-27 NOTE — MAU Note (Addendum)
Tubal ligation 3 1/2 yrs ago. No period in 3 months, last normal period was in April.  Bled 2 days in May, 2 days x2 episodes in June and 2 days in July.  Itching all over body. Pain in lower abd started last night.  Every time she is supposed to start her cycle, she has this pain.

## 2014-05-27 NOTE — MAU Provider Note (Signed)
Attestation of Attending Supervision of Advanced Practitioner (CNM/NP): Evaluation and management procedures were performed by the Advanced Practitioner under my supervision and collaboration.  I have reviewed the Advanced Practitioner's note and chart, and I agree with the management and plan.  HARRAWAY-SMITH, Norene Oliveri 6:01 PM

## 2014-05-27 NOTE — MAU Provider Note (Signed)
History     CSN: [id number]  Arrival date & time 05/27/14  1005   First Provider Initiated Contact with Patient 05/27/14 1147      Chief Complaint  Patient presents with  . Abdominal Pain  . Pruritis    Patient is a 43 y.o. female presenting with abdominal pain.  Abdominal Pain The primary symptoms of the illness include abdominal pain.   [name] is a 43 y/o [phone] with a PMH of cesarean section x 3, ectopic pregnancy and bilateral tubal ligation who presents with diffuse lower abdominal discomfort, low back pain and diffuse itchy rash.  The lower abdominal discomfort is constant crampy and achy 6/10 pain that has been present without change for the past few months and sometimes radiates to her back. It is associated with deep achy pain in her buttocks, thighs and legs. She denies numbness, tingling, weakness in her extremities, or loss of bowel/bladder control. She notes increase in back pain with leaning forward and carrying heavy objects. She also associates stress with increases in low back and abdominal pain. She states that rest and naproxen decrease the abdominal and low back pain. Patient notes irregularly short menstruation x 3 months lasting 1 - 2 days maximum.   Patient additionally complains of a diffuse itchy rash of 5 days duration. Patient notes change in laundry detergent several days prior to rash presentation. She denies recent change in medications, food allergies, environmental allergies, or ingestion of medicinal plants or extracts.  She denies: fever, chills, palpitations, chest tightness, N/V, change in appetite, urinary frequency, dysuria, vaginal bleeding, vaginal discharge, vaginal pain.    Patients presents today due to duration of symptoms and fear of potential ectopy that presented previously with similar constellation of symptoms.     Past Medical History  Diagnosis Date  . Hypertension     Past Surgical History  Procedure  Laterality Date  . Cesarean section    . Tubal ligation    . Ectopic pregnancy surgery      History reviewed. No pertinent family history.  History  Substance Use Topics  . Smoking status: Never Smoker   . Smokeless tobacco: Never Used  . Alcohol Use: No    OB History   Grav Para Term Preterm Abortions TAB SAB Ect Mult Living   4 3 3  1   1  3       Review of Systems  Gastrointestinal: Positive for abdominal pain.   Per HPI  Allergies  Review of patient's allergies indicates no known allergies.  Home Medications  No current outpatient prescriptions on file.  BP 157/87  Pulse 81  Temp(Src) 97.6 F (36.4 C) (Oral)  Resp 18  Ht 4\' 11"  (1.499 m)  Wt 182 lb 9.6 oz (82.827 kg)  BMI 36.86 kg/m2  LMP 05/17/2014  Physical Exam  General: alert, well appearing, and in no distress Chest - clear to auscultation, no wheezes, rales or rhonchi, symmetric air entry Heart - normal rate, regular rhythm, normal S1, S2, no murmurs, rubs, clicks or gallops Abdomen - soft, mildly tender in the LLQ and RLQ, nondistended, no masses or organomegaly, normoactive bowel sounds Pelvic - normal external genitalia, vulva, vagina, cervix, uterus and adnexa Extremities - peripheral pulses normal, no pedal edema, no clubbing or cyanosis, abrasions noted over anterior lower legs bilaterally  Skin: Mild erythema over neck, chest, extremities, abrasions over lower extremities (patient endorses vigorous scratching that lead to bleeding)  Results for orders placed during the  hospital encounter of 05/27/14 (from the past 24 hour(s))  URINALYSIS, ROUTINE W REFLEX MICROSCOPIC     Status: Abnormal   Collection Time    05/27/14 10:25 AM      Result Value Ref Range   Color, Urine YELLOW  YELLOW   APPearance CLEAR  CLEAR   Specific Gravity, Urine <1.005 (*) 1.005 - 1.030   pH 5.5  5.0 - 8.0   Glucose, UA NEGATIVE  NEGATIVE mg/dL   Hgb urine dipstick TRACE (*) NEGATIVE   Bilirubin Urine NEGATIVE   NEGATIVE   Ketones, ur NEGATIVE  NEGATIVE mg/dL   Protein, ur NEGATIVE  NEGATIVE mg/dL   Urobilinogen, UA 0.2  0.0 - 1.0 mg/dL   Nitrite NEGATIVE  NEGATIVE   Leukocytes, UA NEGATIVE  NEGATIVE  URINE MICROSCOPIC-ADD ON     Status: Abnormal   Collection Time    05/27/14 10:25 AM      Result Value Ref Range   Squamous Epithelial / LPF FEW (*) RARE   RBC / HPF 0-2  <3 RBC/hpf   Bacteria, UA RARE  RARE  POCT PREGNANCY, URINE     Status: None   Collection Time    05/27/14 11:08 AM      Result Value Ref Range   Preg Test, Ur NEGATIVE  NEGATIVE  CBC     Status: None   Collection Time    05/27/14 11:52 AM      Result Value Ref Range   WBC 9.1  4.0 - 10.5 K/uL   RBC 4.67  3.87 - 5.11 MIL/uL   Hemoglobin 13.4  12.0 - 15.0 g/dL   HCT 87.539.2  64.336.0 - 32.946.0 %   MCV 83.9  78.0 - 100.0 fL   MCH 28.7  26.0 - 34.0 pg   MCHC 34.2  30.0 - 36.0 g/dL   RDW 51.813.0  84.111.5 - 66.015.5 %   Platelets 247  150 - 400 K/uL  WET PREP, GENITAL     Status: Abnormal   Collection Time    05/27/14  1:58 PM      Result Value Ref Range   Yeast Wet Prep HPF POC NONE SEEN  NONE SEEN   Trich, Wet Prep NONE SEEN  NONE SEEN   Clue Cells Wet Prep HPF POC NONE SEEN  NONE SEEN   WBC, Wet Prep HPF POC FEW (*) NONE SEEN   [place] Transvaginal Non-ob  05/27/2014   CLINICAL DATA:  Pelvic pain.  EXAM: TRANSABDOMINAL AND TRANSVAGINAL ULTRASOUND OF PELVIS  TECHNIQUE: Both transabdominal and transvaginal ultrasound examinations of the pelvis were performed. Transabdominal technique was performed for global imaging of the pelvis including uterus, ovaries, adnexal regions, and pelvic cul-de-sac. It was necessary to proceed with endovaginal exam following the transabdominal exam to visualize the uterus and adnexa.  COMPARISON:  None  FINDINGS: Uterus  Measurements: 9.9 x 4.6 x 5.7 cm. No fibroids or other mass visualized. Trace amount of fluid noted cervix.  Endometrium  Thickness: 10.5 mm. Motion noted within the endometrium on transvaginal exam.   Right ovary  Measurements: 3.4 x 2.2 by 3.7 cm. 2.2 cm simple right ovarian versus paraovarian cyst.  Left ovary  Measurements: 2.5 x 2.1 x 1.6 cm. Normal appearance/no adnexal mass.  Other findings  No free fluid.  IMPRESSION: Trace amount of fluid noted in the cervix. Motion or within the endometrium, these findings could be cyclical.   Electronically Signed   By: Maisie Fushomas  Register   On: 05/27/2014 16:26  US Pelvis Complete  05/27/2014   CLINICAL DATA:  Pelvic pain.  EXAM: TRANSABDOMINAL AND TRANSVAGINAL ULTRASOUND OF PELVIS  TECHNIQUE: Both transabdominal and transvaginal ultrasound examinations of the pelvis were performed. Transabdominal technique was performed for global imaging of the pelvis including uterus, ovaries, adnexal regions, and pelvic cul-de-sac. It was necessary to proceed with endovaginal exam following the transabdominal exam to visualize the uterus and adnexa.  COMPARISON:  None  FINDINGS: Uterus  Measurements: 9.9 x 4.6 x 5.7 cm. No fibroids or other mass visualized. Trace amount of fluid noted cervix.  Endometrium  Thickness: 10.5 mm. Motion noted within the endometrium on transvaginal exam.  Right ovary  Measurements: 3.4 x 2.2 by 3.7 cm. 2.2 cm simple right ovarian versus paraovarian cyst.  Left ovary  Measurements: 2.5 x 2.1 x 1.6 cm. Normal appearance/no adnexal mass.  Other findings  No free fluid.  IMPRESSION: Trace amount of fluid noted in the cervix. Motion or within the endometrium, these findings could be cyclical.   Electronically Signed   By: Maisie Fus  Register   On: 05/27/2014 16:26    MAU Course  Procedures (including critical care time)   MDM  Assessment & Plan:  A: Pelvic pain Rash  P: Above orders Motrin 800 mg po TID prn pains Vistaril 25 mg po q8 hours prn rash If pelvic pain continues/ make an appointment with [facility] Follow up with MAU as needed    Annita Brod, Student-PA 05/27/14

## 2014-05-28 LAB — GC/CHLAMYDIA PROBE AMP
CT Probe RNA: NEGATIVE
GC PROBE AMP APTIMA: NEGATIVE

## 2014-09-05 ENCOUNTER — Ambulatory Visit: Payer: Self-pay

## 2014-09-09 ENCOUNTER — Encounter (HOSPITAL_COMMUNITY): Payer: Self-pay | Admitting: *Deleted

## 2014-09-30 ENCOUNTER — Ambulatory Visit: Payer: Self-pay

## 2014-10-21 ENCOUNTER — Ambulatory Visit: Payer: Self-pay | Attending: Internal Medicine

## 2014-11-07 ENCOUNTER — Ambulatory Visit: Payer: Self-pay | Attending: Family Medicine | Admitting: Family Medicine

## 2014-11-07 ENCOUNTER — Encounter: Payer: Self-pay | Admitting: Family Medicine

## 2014-11-07 VITALS — BP 138/83 | HR 80 | Temp 98.1°F | Ht 59.0 in | Wt 181.0 lb

## 2014-11-07 DIAGNOSIS — Z124 Encounter for screening for malignant neoplasm of cervix: Secondary | ICD-10-CM | POA: Insufficient documentation

## 2014-11-07 DIAGNOSIS — R32 Unspecified urinary incontinence: Secondary | ICD-10-CM | POA: Insufficient documentation

## 2014-11-07 DIAGNOSIS — B9789 Other viral agents as the cause of diseases classified elsewhere: Secondary | ICD-10-CM

## 2014-11-07 DIAGNOSIS — N8320 Unspecified ovarian cysts: Secondary | ICD-10-CM

## 2014-11-07 DIAGNOSIS — Z8679 Personal history of other diseases of the circulatory system: Secondary | ICD-10-CM

## 2014-11-07 DIAGNOSIS — I10 Essential (primary) hypertension: Secondary | ICD-10-CM | POA: Insufficient documentation

## 2014-11-07 DIAGNOSIS — N83201 Unspecified ovarian cyst, right side: Secondary | ICD-10-CM

## 2014-11-07 DIAGNOSIS — J069 Acute upper respiratory infection, unspecified: Secondary | ICD-10-CM

## 2014-11-07 LAB — LIPID PANEL
CHOLESTEROL: 220 mg/dL — AB (ref 0–200)
HDL: 35 mg/dL — ABNORMAL LOW (ref >39)
LDL Cholesterol: 149 mg/dL — ABNORMAL HIGH (ref 0–99)
TRIGLYCERIDES: 179 mg/dL — AB (ref <150)
Total CHOL/HDL Ratio: 6.3 Ratio
VLDL: 36 mg/dL (ref 0–40)

## 2014-11-07 LAB — POCT URINALYSIS DIPSTICK
BILIRUBIN UA: NEGATIVE
GLUCOSE UA: NEGATIVE
KETONES UA: NEGATIVE
LEUKOCYTES UA: NEGATIVE
NITRITE UA: NEGATIVE
Protein, UA: NEGATIVE
Spec Grav, UA: 1.01
Urobilinogen, UA: 0.2
pH, UA: 7

## 2014-11-07 LAB — COMPLETE METABOLIC PANEL WITH GFR
ALK PHOS: 101 U/L (ref 39–117)
ALT: 31 U/L (ref 0–35)
AST: 27 U/L (ref 0–37)
Albumin: 4.5 g/dL (ref 3.5–5.2)
BUN: 11 mg/dL (ref 6–23)
CO2: 27 mEq/L (ref 19–32)
CREATININE: 0.66 mg/dL (ref 0.50–1.10)
Calcium: 9.7 mg/dL (ref 8.4–10.5)
Chloride: 102 mEq/L (ref 96–112)
GFR, Est African American: >89 mL/min
GFR, Est Non African American: >89 mL/min
Glucose, Bld: 99 mg/dL (ref 70–99)
Potassium: 4.1 mEq/L (ref 3.5–5.3)
Sodium: 139 mEq/L (ref 135–145)
Total Bilirubin: 0.4 mg/dL (ref 0.2–1.2)
Total Protein: 8.3 g/dL (ref 6.0–8.3)

## 2014-11-07 LAB — CBC
HCT: 40.5 % (ref 36.0–46.0)
Hemoglobin: 13.2 g/dL (ref 12.0–15.0)
MCH: 27.3 pg (ref 26.0–34.0)
MCHC: 32.6 g/dL (ref 30.0–36.0)
MCV: 83.9 fL (ref 78.0–100.0)
MPV: 11.1 fL (ref 8.6–12.4)
PLATELETS: 279 10*3/uL (ref 150–400)
RBC: 4.83 MIL/uL (ref 3.87–5.11)
RDW: 12.9 % (ref 11.5–15.5)
WBC: 7.6 10*3/uL (ref 4.0–10.5)

## 2014-11-07 MED ORDER — BENZONATATE 100 MG PO CAPS: 100.0000 mg | ORAL_CAPSULE | Freq: Two times a day (BID) | ORAL | Status: DC | PRN

## 2014-11-07 NOTE — Assessment & Plan Note (Signed)
A; with obesity. Suspect detrusor instability. Normal UA w/o sign of UTI P:  Urine culture  kegels  Weight loss

## 2014-11-07 NOTE — Assessment & Plan Note (Signed)
Pap done today  

## 2014-11-07 NOTE — Progress Notes (Signed)
Patient present for a cpe. Patient denies pain and depression. She has had a wet cough x3 days

## 2014-11-07 NOTE — Patient Instructions (Signed)
Ms. [name],  Thank you for coming in today. It was a pleasure meeting you. I look forward to being your primary doctor.   1. R ovarian cyst: pleas go for f/u ultrasound  2. You have a viral URI with cough. For this please do the following:  1. Drink plenty of fluids. Hot tea, soup etc will help open your nasal passages. 2. Tessalon perles for cough suppression 3. Mucinex/robitussin (guaifenessin) to break up secretions this is over the counter   F/u in 4-6 weeks to discuss stress and urinary symptoms. In the meantime try to increase exercise for stress relief. Kegels to strengthen pelvis to prevent bladder leaks.   Dr. Armen PickupFunches    Informacin para el paciente: Ejercicios con los msculos plvicos (ejercicios de Kegel) (Conceptos Bsicos)  Qu son los ejercicios con los msculos plvicos? - Los ejercicios con los msculos plvicos fortalecen los msculos que controlan el flujo de orina y las evacuaciones. Tambin se los Avon Productsconoce como "ejercicios de Kegel" y pueden ayudarlo a evitar prdidas de orina, gases o evacuaciones, si tiene esos problemas. Tambin pueden ayudar con un padecimiento femenino llamado "prolapso de rgano plvico". En las mujeres que tienen prolapso de rgano plvico, los rganos de la parte inferior del estmago se caen y presionan la vagina o sobresalen por ella.   Cmo aprendo a hacer los ejercicios de Kegel? - Primero, pregntele a su mdico o enfermero cmo hacerlos correctamente. l puede ayudarla a empezar. Usted debe aprender cules son los msculos que debe Primary school teachercontraer y a Occupational psychologistveces es difcil distinguir los msculos correctos.  Cheryl NapoleonUna mujer podra aprender a hacer ejercicios de Kegel:  ?Al colocar un dedo dentro de la vagina y Primary school teachercontraer los msculos que rodean el dedo, o  ?Al hacer de cuenta que est sentada sobre una canica y tiene que levantar la canica con la vagina. Un hombre podra aprender a Copyhacer los ejercicios de Kegel al apretar los msculos de las  nalgas como si estuviera conteniendo gases.  Tanto los hombres Avon Productscomo las mujeres pueden aprender a Corporate treasurerhacer ejercicios de Kegel al detener e iniciar el flujo de Towamensing Trailsorina; si lo hace de Andrew Auesta manera, asegrese de Fair Plainhacerlo solo una o dos veces para distinguir los msculos correctos. Algunos mdicos piensan que esto no se debe hacer porque si se acostumbra a ello podra causar una infeccin en la vejiga.  No importa cmo aprenda a hacer los ejercicios de Kegel; lo importante es que sepa que los msculos que se usan no estn en el rea del estmago ni en los muslos.  Cuando ya sepa qu msculos contraer, puede hacer los ejercicios en cualquier posicin (sentado en una silla o acostado). No es necesario que los haga en el bao.   Con qu frecuencia debo hacer los ejercicios? - Haga los ejercicios tres veces al da, 3 o 4 das por Norwoodsemana. Flexione sus msculos entre 8 y 12 veces cada vez, y Buyer, retailmantngalos contrados unos 6 a 8 segundos cada vez que los Talbottoncontraiga. Siga esta rutina durante al menos 3 a 4 meses. Probablemente note los Bassettresultados, One Capital Waypero podra tomar un poco de Manchestertiempo.  Cmo ayudan los ejercicios de Kegel? - Los ejercicios de Kegel pueden ayudar a: ?Reducir las prdidas de Doctor, general practiceorina en personas con "incontinencia por estrs", lo que significa que tienen prdidas de orina cuando tosen, ren, estornudan o Engineer, waterhacen un esfuerzo ?Controlar la necesidad sbita de Horticulturist, commercialorinar en personas con "incontinencia por urgencia". (La incontinencia por urgencia tambin se conoce como incontinencia por emergencia). ?Controlar la liberacin  de gases o evacuaciones ?Reducir la presin o la protuberancia en la vagina causada por el prolapso de rgano plvico (si tiene una protuberancia en la vagina, consulte a su mdico o enfermero para descubrir la causa) Es posible que los ejercicios de Kegel tambin reduzcan las prdidas de Comorosorina en hombres que se han sometido a Leisure centre managerciruga para Animatortratar el cncer de prstata o el agrandamiento de la prstata.   Ms informacin sobre [address] [place] [facility] tema

## 2014-11-07 NOTE — Assessment & Plan Note (Signed)
You have a viral URI with cough. For this please do the following:  1. Drink plenty of fluids. Hot tea, soup etc will help open your nasal passages. 2. Tessalon perles for cough suppression 3. Mucinex/robitussin (guaifenessin) to break up secretions this is over the counter

## 2014-11-07 NOTE — Assessment & Plan Note (Signed)
A: noted in history P: F.u ultrasound

## 2014-11-07 NOTE — Progress Notes (Signed)
   Subjective:    Patient ID: Cheryl NovasMaria Cristina Oliver, female    DOB: Jan 21, 1971, 43 y.o.   MRN: [id number] CC: establish care, physical  HPI 43 yo Hispanic female presents to establish care and for wellness physical with pap smear.   Soc Hx: chronic non smoker  Med Hx: hx of HTN noted in 2011, no treatment  Surg Hx:  C-section  Review of Systems General:  Negative for nexplained weight loss, fever Skin: Negative for new or changing mole, sore that won't heal HEENT: Positive for sore throat x 3 days. Negative for trouble hearing, trouble seeing, ringing in ears, mouth sores, hoarseness, change in voice, dysphagia. CV:  Negative for chest pain, dyspnea, edema, palpitations Resp: Positive for cough x 3 days. Negative for cough, dyspnea, hemoptysis GI: Negative for nausea, vomiting, diarrhea, constipation, abdominal pain, melena, hematochezia. GU: Positive for leaky bladder.  Negative for dysuria, incontinence, urinary hesitance, hematuria, vaginal or penile discharge, polyuria, sexual difficulty, lumps in testicle or breasts MSK: Negative for muscle cramps or aches, joint pain or swelling Neuro: Positive for burning pain in low back.  Negative for headaches, weakness, numbness, dizziness, passing out/fainting Psych: Positive for stress.  Negative for depression, anxiety, memory problems    Objective:   Physical Exam BP 138/83 mmHg  Pulse 80  Temp(Src) 98.1 F (36.7 C) (Oral)  Ht 4\' 11"  (1.499 m)  Wt 181 lb (82.101 kg)  BMI 36.54 kg/m2  SpO2 98%  General Appearance:    Alert, cooperative, no distress, appears stated age  Head:    Normocephalic, without obvious abnormality, atraumatic  Eyes:    PERRL, conjunctiva/corneas clear, EOM's intact,  both eyes  Ears:    Normal TM's and external ear canals, both ears  Nose:   Nares normal, septum midline, mucosa normal, no drainage    or sinus tenderness  Throat:   Lips, mucosa, and tongue normal; teeth and gums normal  Neck:    Supple, symmetrical, trachea midline, no adenopathy;    thyroid:  no enlargement/tenderness/nodules; no carotid   bruit or JVD  Back:     Symmetric, no curvature, ROM normal, no CVA tenderness  Lungs:     Clear to auscultation bilaterally, respirations unlabored  Chest Wall:    No tenderness or deformity   Heart:    Regular rate and rhythm, S1 and S2 normal, no murmur, rub   or gallop  Breast Exam:    No tenderness, masses, or nipple abnormality  Abdomen:     Soft, non-tender, bowel sounds active all four quadrants,    no masses, no organomegaly  Genitalia:    Normal female without lesion, discharge or tenderness, normal vagina, No CMT. No uterine adnexal mass or tenderness.   Rectal:    Deferred   Extremities:   Extremities normal, atraumatic, no cyanosis or edema  Pulses:   2+ and symmetric all extremities  Skin:   Skin color, texture, turgor normal, no rashes or lesions  Lymph nodes:   Cervical, supraclavicular, and axillary nodes normal  Neurologic:   CNII-XII intact, normal strength, sensation and reflexes    throughout         Assessment & Plan:

## 2014-11-09 LAB — URINE CULTURE

## 2014-11-12 LAB — CERVICOVAGINAL ANCILLARY ONLY
CHLAMYDIA, DNA PROBE: NEGATIVE
Neisseria Gonorrhea: NEGATIVE
Wet Prep (BD Affirm): NEGATIVE
Wet Prep (BD Affirm): NEGATIVE
Wet Prep (BD Affirm): NEGATIVE

## 2014-11-12 LAB — CYTOLOGY - PAP

## 2014-11-13 ENCOUNTER — Telehealth: Payer: Self-pay | Admitting: *Deleted

## 2014-11-13 NOTE — Telephone Encounter (Signed)
-----   Message from Lora PaulaJosalyn C Funches, MD sent at 11/13/2014  1:13 PM EST ----- Normal CMP.  Negative wet prep, GC, chlam. Negative urine culture. High cholesterol  Without HTN or diabetes, low fat diet and increase exercise. Plan to recheck cholesterol in 6 months, fasting.

## 2014-11-13 NOTE — Telephone Encounter (Signed)
Pt aware of lab results 

## 2014-11-14 ENCOUNTER — Ambulatory Visit (HOSPITAL_COMMUNITY)
Admission: RE | Admit: 2014-11-14 | Discharge: 2014-11-14 | Disposition: A | Discharge status: Home / Self Care | Payer: Self-pay | Source: Ambulatory Visit | Attending: Family Medicine | Admitting: Family Medicine

## 2014-11-14 ENCOUNTER — Telehealth: Payer: Self-pay | Admitting: *Deleted

## 2014-11-14 DIAGNOSIS — N8320 Unspecified ovarian cysts: Secondary | ICD-10-CM | POA: Insufficient documentation

## 2014-11-14 DIAGNOSIS — N83201 Unspecified ovarian cyst, right side: Secondary | ICD-10-CM

## 2014-11-14 NOTE — Telephone Encounter (Signed)
Pt aware of US results. (information was given in Bah[place]Spanish)

## 2014-11-14 NOTE — Telephone Encounter (Signed)
-----   Message from Lora PaulaJosalyn C Funches, MD sent at 11/14/2014  4:24 PM EST ----- Right ovarian cyst has resolved

## 2015-09-17 ENCOUNTER — Ambulatory Visit: Payer: Self-pay | Attending: Family Medicine

## 2015-10-01 ENCOUNTER — Ambulatory Visit: Payer: Self-pay

## 2015-10-10 ENCOUNTER — Ambulatory Visit: Payer: Self-pay | Attending: Family Medicine | Admitting: Family Medicine

## 2015-10-10 ENCOUNTER — Encounter: Payer: Self-pay | Admitting: Family Medicine

## 2015-10-10 VITALS — BP 131/94 | HR 95 | Temp 97.8°F | Resp 16 | Ht 61.0 in | Wt 182.0 lb

## 2015-10-10 DIAGNOSIS — Z8679 Personal history of other diseases of the circulatory system: Secondary | ICD-10-CM | POA: Insufficient documentation

## 2015-10-10 DIAGNOSIS — N390 Urinary tract infection, site not specified: Secondary | ICD-10-CM | POA: Insufficient documentation

## 2015-10-10 DIAGNOSIS — R109 Unspecified abdominal pain: Secondary | ICD-10-CM | POA: Insufficient documentation

## 2015-10-10 DIAGNOSIS — R319 Hematuria, unspecified: Secondary | ICD-10-CM | POA: Insufficient documentation

## 2015-10-10 DIAGNOSIS — R300 Dysuria: Secondary | ICD-10-CM | POA: Insufficient documentation

## 2015-10-10 DIAGNOSIS — R3129 Other microscopic hematuria: Secondary | ICD-10-CM | POA: Insufficient documentation

## 2015-10-10 DIAGNOSIS — I10 Essential (primary) hypertension: Secondary | ICD-10-CM | POA: Insufficient documentation

## 2015-10-10 LAB — POCT URINALYSIS DIPSTICK
Bilirubin, UA: NEGATIVE
Glucose, UA: NEGATIVE
KETONES UA: NEGATIVE
Leukocytes, UA: NEGATIVE
Nitrite, UA: POSITIVE
PH UA: 5
PROTEIN UA: NEGATIVE
Spec Grav, UA: <=1.005
UROBILINOGEN UA: 0.2

## 2015-10-10 MED ORDER — CIPROFLOXACIN HCL 500 MG PO TABS: 500.0000 mg | ORAL_TABLET | Freq: Two times a day (BID) | ORAL | Status: DC

## 2015-10-10 NOTE — Progress Notes (Signed)
Subjective:    Patient ID: [name], female    DOB: 07/05/1971, 44 y.o.   MRN: [id number]  HPI  44 year old female with a history of hypertension who comes into the clinic complaining of dysuria, lower abdominal pain, bilateral flank pain with no fever. Of note she took AZO for her symptoms. She has had symptoms  since this morning She is currently menstruating.  Denies nausea or vomiting.  Past Medical History  Diagnosis Date  . Hypertension     2011    Past Surgical History  Procedure Laterality Date  . Ectopic pregnancy surgery  1994   . Cesarean section      2011  . Tubal ligation      2011    Social History   Social History  . Marital Status: Single    Spouse Name: N/A  . Number of Children: 3   . Years of Education: 12   Occupational History  . Homemaker     Social History Main Topics  . Smoking status: Never Smoker   . Smokeless tobacco: Never Used  . Alcohol Use: No  . Drug Use: No  . Sexual Activity: Yes    Birth Control/ Protection: Surgical   Other Topics Concern  . Not on file   Social History Narrative   Lives at home with 3 children.   Does have a partner.    From [place] moved to [place] 12 years ago.     No Known Allergies  Current Outpatient Prescriptions on File Prior to Visit  Medication Sig Dispense Refill  . benzonatate (TESSALON) 100 MG capsule Take 1 capsule (100 mg total) by mouth 2 (two) times daily as needed for cough. (Patient not taking: Reported on 10/10/2015) 20 capsule 0   No current facility-administered medications on file prior to visit.       Review of Systems  Constitutional: Negative for activity change and appetite change.  HENT: Negative for sinus pressure and sore throat.   Respiratory: Negative for chest tightness, shortness of breath and wheezing.   Gastrointestinal: Negative for abdominal pain, constipation and abdominal distention.  Genitourinary:       See history of present  illness   Musculoskeletal:       Bilateral flank pain  Psychiatric/Behavioral: Negative for behavioral problems and dysphoric mood.       Objective: Filed Vitals:   10/10/15 0913  BP: 131/94  Pulse: 95  Temp: 97.8 F (36.6 C)  TempSrc: Oral  Resp: 16  Height:  (1.549 m)  Weight: 182 lb (82.555 kg)  SpO2: 98%      Physical Exam  Constitutional: She is oriented to person, place, and time. She appears well-developed and well-nourished.  Cardiovascular: Normal rate, normal heart sounds and intact distal pulses.   No murmur heard. Pulmonary/Chest: Effort normal and breath sounds normal. She has no wheezes. She has no rales. She exhibits no tenderness.  Abdominal: Soft. Bowel sounds are normal. She exhibits no distension and no mass. There is no tenderness.  Musculoskeletal:  Mild right CVA tenderness  Neurological: She is alert and oriented to person, place, and time.          Assessment & Plan:  Urinary tract infection: UA positive for UTI. Microscopic hematuria on UA secondary to patient currently menstruating. Advised increase fluid intake.  Hypertension: Mild diastolic elevation. No regimen changes, low-sodium diet, continue antihypertensives and keep appointment with PCP.  This note has been created with Teaching laboratory technician  and smart Lobbyistphrase technology. Any transcriptional errors are unintentional.

## 2015-10-10 NOTE — Progress Notes (Signed)
Pt's experiencing UTI sxs. Pt states that yesterday was her first day of menses. Pain in abdomen and lower back.   Patient states that there's no smell, but pressure. She feels the need to void. Pain rated at 5/10. Patient took AZO.

## 2015-10-10 NOTE — Patient Instructions (Signed)
  Infeccin urinaria  (Urinary Tract Infection)  La infeccin urinaria puede ocurrir en cualquier lugar del tracto urinario. El tracto urinario es un sistema de drenaje del cuerpo por el que se eliminan los desechos y el exceso de agua. El tracto urinario est formado por dos riones, dos urteres, la vejiga y la uretra. Los riones son rganos que tienen forma de frijol. Cada rin tiene aproximadamente el tamao del puo. Estn situados debajo de las costillas, uno a cada lado de la columna vertebral CAUSAS  La causa de la infeccin son los microbios, que son organismos microscpicos, que incluyen hongos, virus, y bacterias. Estos organismos son tan pequeos que slo pueden verse a travs del microscopio. Las bacterias son los microorganismos que ms comnmente causan infecciones urinarias.  SNTOMAS  Los sntomas pueden variar segn la edad y el sexo del paciente y por la ubicacin de la infeccin. Los sntomas en las mujeres jvenes incluyen la necesidad frecuente e intensa de orinar y una sensacin dolorosa de ardor en la vejiga o en la uretra durante la miccin. Las mujeres y los hombres mayores podrn sentir cansancio, temblores y debilidad y sentir dolores musculares y dolor abdominal. Si tiene fiebre, puede significar que la infeccin est en los riones. Otros sntomas son dolor en la espalda o en los lados debajo de las costillas, nuseas y vmitos.  DIAGNSTICO  Para diagnosticar una infeccin urinaria, el mdico le preguntar acerca de sus sntomas. Tambin le solicitar una muestra de orina. La muestra de orina se analiza para detectar bacterias y glbulos blancos de la sangre. Los glbulos blancos se forman en el organismo para ayudar a combatir las infecciones.  TRATAMIENTO  Por lo general, las infecciones urinarias pueden tratarse con medicamentos. Debido a que la mayora de las infecciones son causadas por bacterias, por lo general pueden tratarse con antibiticos. La eleccin del  antibitico y la duracin del tratamiento depender de sus sntomas y el tipo de bacteria causante de la infeccin.  INSTRUCCIONES PARA EL CUIDADO EN EL HOGAR   Si le recetaron antibiticos, tmelos exactamente como su mdico le indique. Termine el medicamento aunque se sienta mejor despus de haber tomado slo algunos.  Beba gran cantidad de lquido para mantener la orina de tono claro o color amarillo plido.  Evite la cafena, el t y las bebidas gaseosas. Estas sustancias irritan la vejiga.  Vaciar la vejiga con frecuencia. Evite retener la orina durante largos perodos.  Vace la vejiga antes y despus de tener relaciones sexuales.  Despus de mover el intestino, las mujeres deben higienizarse la regin perineal desde adelante hacia atrs. Use slo un papel tissue por vez. SOLICITE ATENCIN MDICA SI:   Siente dolor en la espalda.  Le sube la fiebre.  Los sntomas no mejoran luego de 3 das. SOLICITE ATENCIN MDICA DE INMEDIATO SI:   Siente dolor intenso en la espalda o en la zona inferior del abdomen.  Comienza a sentir escalofros.  Tiene nuseas o vmitos.  Tiene una sensacin continua de quemazn o molestias al orinar. ASEGRESE DE QUE:   Comprende estas instrucciones.  Controlar su enfermedad.  Solicitar ayuda de inmediato si no mejora o empeora.   Esta informacin no tiene como fin reemplazar el consejo del mdico. Asegrese de hacerle al mdico cualquier pregunta que tenga.   Document Released: 08/04/2005 Document Revised: 07/19/2012 Elsevier Interactive Patient Education 2016 [employer] Inc.  

## 2015-10-15 ENCOUNTER — Ambulatory Visit: Payer: Self-pay | Attending: Family Medicine

## 2016-02-16 ENCOUNTER — Encounter: Payer: Self-pay | Admitting: Family Medicine

## 2016-02-16 ENCOUNTER — Ambulatory Visit: Payer: Self-pay | Attending: Family Medicine | Admitting: Family Medicine

## 2016-02-16 VITALS — BP 128/84 | HR 82 | Temp 98.1°F | Resp 16 | Ht 61.0 in | Wt 182.0 lb

## 2016-02-16 DIAGNOSIS — N951 Menopausal and female climacteric states: Secondary | ICD-10-CM

## 2016-02-16 DIAGNOSIS — Z79899 Other long term (current) drug therapy: Secondary | ICD-10-CM | POA: Insufficient documentation

## 2016-02-16 DIAGNOSIS — R1900 Intra-abdominal and pelvic swelling, mass and lump, unspecified site: Secondary | ICD-10-CM

## 2016-02-16 DIAGNOSIS — N644 Mastodynia: Secondary | ICD-10-CM | POA: Insufficient documentation

## 2016-02-16 DIAGNOSIS — N926 Irregular menstruation, unspecified: Secondary | ICD-10-CM | POA: Insufficient documentation

## 2016-02-16 DIAGNOSIS — Z780 Asymptomatic menopausal state: Secondary | ICD-10-CM | POA: Insufficient documentation

## 2016-02-16 LAB — POCT URINE PREGNANCY: PREG TEST UR: NEGATIVE

## 2016-02-16 NOTE — Progress Notes (Signed)
Subjective:  Patient ID: [name], female    DOB: 1971/08/03  Age: 45 y.o. MRN: [id number]  CC: Menstrual Problem  Spanish interpreter used   HPI Cheryl GroveMaria Cristina Oliver presents for   1. Irregular menses: last year in 02/2015 irregular for 3 months. This year irregular  from 11/2015-01/2016. She had period from 3/10-3/10/2016. Very light. This was her last menstrual period. She has also noticed feeling irritable. Gets mad and yell, then feels guilty. Feeling like this for 2 months. She feels hot and sweaty when she worries. No changes in weight. When she gets nervous she wants to keep eating. No changes to sleep. She never taking hormonal contraceptives. Her sister went through menopause at 6143 years.   2. Breast pain: 2/17 started with R breast pain. In 4/17 she developed L breast pain. She denies breast lump or skin changes. No trauma to her breast.   Social History  Substance Use Topics  . Smoking status: Never Smoker   . Smokeless tobacco: Never Used  . Alcohol Use: No    Outpatient Prescriptions Prior to Visit  Medication Sig Dispense Refill  . benzonatate (TESSALON) 100 MG capsule Take 1 capsule (100 mg total) by mouth 2 (two) times daily as needed for cough. (Patient not taking: Reported on 10/10/2015) 20 capsule 0  . ciprofloxacin (CIPRO) 500 MG tablet Take 1 tablet (500 mg total) by mouth 2 (two) times daily. 6 tablet 0   No facility-administered medications prior to visit.    ROS Review of Systems  Constitutional: Negative for fever and chills.  Eyes: Negative for visual disturbance.  Respiratory: Negative for shortness of breath.   Cardiovascular: Negative for chest pain.  Gastrointestinal: Negative for abdominal pain and blood in stool.  Endocrine: Negative for heat intolerance.  Genitourinary: Positive for menstrual problem.  Musculoskeletal: Negative for back pain and arthralgias.  Skin: Negative for rash.  Allergic/Immunologic:  Negative for immunocompromised state.  Hematological: Negative for adenopathy. Does not bruise/bleed easily.  Psychiatric/Behavioral: Positive for dysphoric mood. Negative for suicidal ideas and sleep disturbance.    Objective:  BP 128/84 mmHg  Pulse 82  Temp(Src) 98.1 F (36.7 C) (Oral)  Resp 16  Ht 5\' 1"  (1.549 m)  Wt 182 lb (82.555 kg)  BMI 34.41 kg/m2  SpO2 99%  LMP 01/16/2016  BP/Weight 02/16/2016 10/10/2015 11/07/2014  Systolic BP 128 131 138  Diastolic BP 84 94 83  Wt. (Lbs) 182 182 181  BMI 34.41 34.41 36.54   Physical Exam  Constitutional: She is oriented to person, place, and time. She appears well-developed and well-nourished. No distress.  HENT:  Head: Normocephalic and atraumatic.  Cardiovascular: Normal rate, regular rhythm, normal heart sounds and intact distal pulses.   Pulmonary/Chest: Effort normal and breath sounds normal. Right breast exhibits inverted nipple and tenderness. Right breast exhibits no mass, no nipple discharge and no skin change. Left breast exhibits inverted nipple and tenderness. Left breast exhibits no mass, no nipple discharge and no skin change. Breasts are symmetrical.    Genitourinary: Uterus is enlarged (supracervical enlargement ). Uterus is not deviated, not fixed and not tender. Right adnexum displays no mass, no tenderness and no fullness. Left adnexum displays no mass, no tenderness and no fullness.  Musculoskeletal: She exhibits no edema.  Neurological: She is alert and oriented to person, place, and time.  Skin: Skin is warm and dry. No rash noted.  Psychiatric: She has a normal mood and affect.   U preg: negative  Assessment &  Plan:   [name] was seen today for menstrual problem.  Diagnoses and all orders for this visit:  Irregular menstrual cycle -     POCT urine pregnancy -     US Pelvis Complete; Future -     US Transvaginal Non-OB; Future  Perimenopausal  Pain of both breasts -     MM DIAG BREAST TOMO BILATERAL;  Future  Pelvic fullness in female -     US Pelvis Complete; Future -     US Transvaginal Non-OB; Future    No orders of the defined types were placed in this encounter.    Follow-up: No Follow-up on file.   Dessa Phi MD

## 2016-02-16 NOTE — Patient Instructions (Addendum)
[name] was seen today for menstrual problem.  Diagnoses and all orders for this visit:  Irregular menstrual cycle -     POCT urine pregnancy -     US Pelvis Complete; Future -     US Transvaginal Non-OB; Future  Perimenopausal  Pain of both breasts -     MM DIAG BREAST TOMO BILATERAL; Future  Pelvic fullness in female -     US Pelvis Complete; Future -     US Transvaginal Non-OB; Future   F/u in 3 months for perimenopause   Dr. Armen Pickup   Perimenopausia (Perimenopause) La perimenopausia es el momento en que su cuerpo comienza a pasar a la menopausia (sin menstruacin durante 12 meses consecutivos). Es un proceso natural. La perimenopausia puede comenzar entre 2 y 8 aos antes de la menopausia y por lo general tiene una duracin de 1 ao ms pasada la menopausia. [employer], los ovarios podran producir un vulo o no. Los ovarios varan su produccin de las hormonas estrgeno y Psychiatric nurse. Esto puede causar perodos menstruales irregulares, dificultad para quedar embarazada, hemorragia vaginal entre perodos y sntomas incmodos. CAUSAS  Produccin irregular de las hormonas ovricas estrgeno y Education officer, museum, y no ovular todos los meses.  Otras causas son:  Tumor de la glndula pituitaria.  Enfermedades que [employer] ovarios.  Radioterapia.  Quimioterapia.  Causas desconocidas.  Fumar mucho y abusar del consumo de alcohol puede llevar a que la perimenopausia aparezca antes. SIGNOS Y SNTOMAS   Acaloramiento.  Sudoracin nocturna.  Perodos menstruales irregulares.  Disminucin del deseo sexual.  Sequedad vaginal.  Dolores de cabeza.  Cambios en el estado de nimo.  Depresin.  Problemas de memoria.  Irritabilidad.  Cansancio.  Aumento de [place].  Problemas para quedar embarazada.  Prdida de clulas seas (osteoporosis).  Comienzo de endurecimiento de las arterias (aterosclerosis). DIAGNSTICO  El mdico realizar un  diagnstico en funcin de su edad, historial de perodos menstruales y sntomas. Le realizarn un examen fsico para ver si hay algn cambio en su cuerpo, en especial en sus rganos reproductores. Las pruebas hormonales pueden ser o no tiles segn la cantidad de hormonas femeninas que produzca y [employer] produzca. Sin embargo, podrn Tree surgeon pruebas hormonales para Music therapist. TRATAMIENTO  En algunos casos, no se necesita tratamiento. La decisin acerca de qu tratamiento es necesario durante la perimenopausia deber realizarse en conjunto con su mdico segn cmo estn afectando los sntomas a su estilo de vida. Existen varios tratamientos disponibles, como:  Warehouse manager cada sntoma individual con medicamentos especficos para ese sntoma.  Algunos medicamentos herbales pueden ayudar en sntomas especficos.  Psicoterapia.  Terapia grupal. INSTRUCCIONES PARA EL CUIDADO EN EL HOGAR   Controle sus periodos menstruales (cundo ocurren, qu tan abundantes son, cunto tiempo pasa entre perodos, y cunto duran) como tambin sus sntomas y cundo comenzaron.  Tome slo medicamentos de venta libre o recetados, segn las indicaciones del mdico.  Duerma y descanse.  Haga actividad fsica.  Consuma una dieta que contenga calcio (bueno para los [place]) y productos derivados de la soja (actan como estrgenos).  No fume.  Evite las bebidas alcohlicas.  Tome los suplementos vitamnicos segn las indicaciones del mdico. En ciertos casos, puede ser de [place] tomar vitamina E.  Tome suplementos de calcio y vitamina D para ayudar a Research scientist (medical) prdida sea.  En algunos casos la terapia de grupo podr ayudarla.  La acupuntura puede ser de ayuda en ciertos casos. SOLICITE ATENCIN MDICA SI:  Tiene preguntas acerca de sus sntomas.  Necesita ser derivada a un especialista (gineclogo, psiquiatra, o psiclogo). SOLICITE ATENCIN MDICA DE INMEDIATO SI:   Sufre una  hemorragia vaginal abundante.  Su perodo menstrual dura ms de 8 das.  Sus perodos son recurrentes cada menos de [address]21 das.  Tiene hemorragias durante las The St. Paul Travelersrelaciones sexuales.  Est muy deprimido.  Siente dolor al ConocoPhillipsorinar.  Siente dolor de cabeza intenso.  Tiene problemas de visin.   Esta informacin no tiene Theme park managercomo fin reemplazar el consejo del mdico. Asegrese de hacerle al mdico cualquier pregunta que tenga.   Document Released: 10/25/2005 Document Revised: 08/15/2013 Elsevier Interactive Patient Education [employer]2016 Elsevier Inc.

## 2016-02-16 NOTE — Progress Notes (Signed)
Irregular menses  Mood changes  Stated has a cycle on jan 2,2017 and March 10 lasting only two days Sexually active, no pain with intercourse  Breast pain x pain scale #2 No tobacco user  No suicidal thoughts in the past two weeks

## 2016-02-17 ENCOUNTER — Telehealth: Payer: Self-pay | Admitting: *Deleted

## 2016-02-17 DIAGNOSIS — N951 Menopausal and female climacteric states: Secondary | ICD-10-CM | POA: Insufficient documentation

## 2016-02-17 DIAGNOSIS — R1900 Intra-abdominal and pelvic swelling, mass and lump, unspecified site: Secondary | ICD-10-CM | POA: Insufficient documentation

## 2016-02-17 DIAGNOSIS — N644 Mastodynia: Secondary | ICD-10-CM | POA: Insufficient documentation

## 2016-02-17 NOTE — Telephone Encounter (Signed)
Pt aware of appointments  Mammogram on 02/23/2016 at 10:30 arriving at 10:10. Do not use any deodorant or lotion on body  [place] on 02/26/2016 at 2:00 arriving 15 min early. Full bladder  Information was given to Pt in Spanish

## 2016-02-17 NOTE — Assessment & Plan Note (Signed)
A: pelvic fullness with scant irregular menses. Possible uterine fibroids P: Pelvic and TVUS

## 2016-02-17 NOTE — Assessment & Plan Note (Signed)
Peri-nipple pain of both breat without breast mass B/l diagnostic mammogram

## 2016-02-17 NOTE — Assessment & Plan Note (Signed)
A; perimenopausal menstrual irregularities and mood changes. P: Reassurance Exercise Increased communication with family journaling her feelings/emotions

## 2016-02-23 ENCOUNTER — Ambulatory Visit
Admission: RE | Admit: 2016-02-23 | Discharge: 2016-02-23 | Disposition: A | Discharge status: Home / Self Care | Payer: No Typology Code available for payment source | Source: Ambulatory Visit | Attending: Family Medicine | Admitting: Family Medicine

## 2016-02-23 DIAGNOSIS — N644 Mastodynia: Secondary | ICD-10-CM

## 2016-02-25 ENCOUNTER — Telehealth: Payer: Self-pay | Admitting: *Deleted

## 2016-02-25 NOTE — Telephone Encounter (Signed)
LVM to return call.

## 2016-02-25 NOTE — Telephone Encounter (Signed)
-----   Message from Jaclyn ShaggyEnobong Amao, MD sent at 02/24/2016  8:18 AM EDT ----- Mammogram reveals no evidence of malignancy. Screening mammogram in one year is recommended

## 2016-02-25 NOTE — Telephone Encounter (Signed)
Pt. Returned call. Please f/u °

## 2016-02-26 ENCOUNTER — Ambulatory Visit (HOSPITAL_COMMUNITY): Admission: RE | Admit: 2016-02-26 | Payer: Self-pay | Source: Ambulatory Visit

## 2016-03-01 ENCOUNTER — Ambulatory Visit (HOSPITAL_COMMUNITY)
Admission: RE | Admit: 2016-03-01 | Discharge: 2016-03-01 | Disposition: A | Discharge status: Home / Self Care | Payer: No Typology Code available for payment source | Source: Ambulatory Visit | Attending: Family Medicine | Admitting: Family Medicine

## 2016-03-01 DIAGNOSIS — N926 Irregular menstruation, unspecified: Secondary | ICD-10-CM | POA: Insufficient documentation

## 2016-03-01 DIAGNOSIS — R1900 Intra-abdominal and pelvic swelling, mass and lump, unspecified site: Secondary | ICD-10-CM | POA: Insufficient documentation

## 2016-03-03 NOTE — Telephone Encounter (Signed)
-----   Message from Dessa PhiJosalyn Funches, MD sent at 03/02/2016  9:21 AM EDT ----- Normal pelvic and TV ultrasound

## 2016-03-03 NOTE — Telephone Encounter (Signed)
Date of birth verified by pt  Normal [place] and normal mammogram results  Pt verbalized understanding  Information given in Spanish

## 2017-11-14 ENCOUNTER — Ambulatory Visit (HOSPITAL_COMMUNITY)
Admission: EM | Admit: 2017-11-14 | Discharge: 2017-11-14 | Disposition: A | Discharge status: Home / Self Care | Payer: Self-pay | Attending: Family Medicine | Admitting: Family Medicine

## 2017-11-14 ENCOUNTER — Other Ambulatory Visit: Payer: Self-pay

## 2017-11-14 ENCOUNTER — Encounter (HOSPITAL_COMMUNITY): Payer: Self-pay | Admitting: Emergency Medicine

## 2017-11-14 DIAGNOSIS — R0789 Other chest pain: Secondary | ICD-10-CM

## 2017-11-14 DIAGNOSIS — I10 Essential (primary) hypertension: Secondary | ICD-10-CM

## 2017-11-14 DIAGNOSIS — R030 Elevated blood-pressure reading, without diagnosis of hypertension: Secondary | ICD-10-CM

## 2017-11-14 DIAGNOSIS — R079 Chest pain, unspecified: Secondary | ICD-10-CM

## 2017-11-14 MED ORDER — AMLODIPINE BESYLATE 5 MG PO TABS: 5.0000 mg | ORAL_TABLET | Freq: Every day | ORAL | 0 refills | Status: DC

## 2017-11-14 MED ORDER — RANITIDINE HCL 150 MG PO TABS: 150.0000 mg | ORAL_TABLET | Freq: Two times a day (BID) | ORAL | 0 refills | Status: DC

## 2017-11-14 NOTE — Discharge Instructions (Addendum)
Tome 500mg  de Tylenol con ibuprofen 600mg  cada 6 horas con comida para dolor y inflammacion.  [facility] [phone] [address], [place], [place] [address]

## 2017-11-14 NOTE — ED Provider Notes (Signed)
  MRN: [id number] DOB: May 31, 1971  Subjective:   [name] is a 47 y.o. female presenting for 1 week history of intermittent mid-left sided pressure type chest pain. Pain lasts several minutes, can radiate into her left arm, toward her back, occurs randomly. Has associated dizziness, transient shob, headache with her chest pain. Admits that she does a lot of heavy lifting and physically strenuous labor at home, daily. Denies diaphoresis, nausea, vomiting, abdominal pain, cough. Denies polydipsia, polyuria, numbness or tingling of hands and feet. Has not tried any medications. Has used ibuprofen or APAP for her headaches. Denies smoking cigarettes or drinking alcohol.   [name] is not currently taking any medications and has No Known Allergies.  [name]  has a past medical history of Hypertension. Also  has a past surgical history that includes Ectopic pregnancy surgery (1994 ); Cesarean section; and Tubal ligation. Her family history includes Diabetes in her mother; Hypertension in her mother.   Objective:   Vitals: BP (!) 143/87 (BP Location: Left Arm)   Pulse 90   Temp 98.3 F (36.8 C) (Oral)   LMP 10/09/2017   SpO2 100%   BP Readings from Last 3 Encounters:  11/14/17 (!) 143/87  02/16/16 128/84  10/10/15 (!) 131/94    Wt Readings from Last 3 Encounters:  02/16/16 182 lb (82.6 kg)  10/10/15 182 lb (82.6 kg)  11/07/14 181 lb (82.1 kg)   Physical Exam  Constitutional: She is oriented to person, place, and time. She appears well-developed and well-nourished.  HENT:  Mouth/Throat: Oropharynx is clear and moist.  Cardiovascular: Normal rate, regular rhythm and intact distal pulses. Exam reveals no gallop and no friction rub.  No murmur heard. Pulmonary/Chest: No respiratory distress. She has no wheezes. She has no rales. She exhibits tenderness (over area depicted).    Abdominal: Soft. Bowel sounds are normal. She exhibits no distension and no mass. There is no  tenderness. There is no guarding.  Musculoskeletal: She exhibits no edema.  Neurological: She is alert and oriented to person, place, and time.  Skin: Skin is warm and dry.  Psychiatric: She has a normal mood and affect.   ED ECG REPORT   Date: 11/14/2017  Rate: 84bpm  Rhythm: normal sinus rhythm  QRS Axis: normal  Intervals: normal  ST/T Wave abnormalities: normal  Conduction Disutrbances:none  Narrative Interpretation: sinus rhythm without acute abnormalities  Old EKG Reviewed: none available  I have personally reviewed the EKG tracing and agree with the computerized printout as noted.   Assessment and Plan :   Atypical chest pain  Essential hypertension  Elevated blood pressure reading   Due to reproducible nature of patient's chest pain and strenuous home activities we will manage her for musculoskeletal type pain with rest, hydration, NSAID with APAP. Also counseled on GERD as a source of her pain. Recommended patient set up primary care so that she can have a full work up and continued management for her HTN. No signs of ACS today and had totally normal ECG. Strict ER precautions given.   Wallis BambergMani, Irven Ingalsbe, PA-C 11/14/17 1100

## 2017-11-14 NOTE — ED Triage Notes (Addendum)
Pt reports chest pain just left of her central chest.  She states it radiates through her left shoulder and down her left arm to her left pinky.  Pt states this happens at rest.  It usually last about an hour and it goes away when she belches.  They state this happened last night and again this morning.  They also report an episode right before the new year.  She states it feels like a bubble.  The pain gets as high as a 6/10 with some SOB, headache, dizziness and diaphoresis.

## 2017-11-23 ENCOUNTER — Ambulatory Visit: Payer: No Typology Code available for payment source

## 2017-11-23 NOTE — Progress Notes (Signed)
Patient ID: [name], female   DOB: [birth year], 47 y.o.   MRN: [id number]     [name], is a 47 y.o. female  WUJ:[id number]  [id number]  DOB - 1970-12-10  Subjective:  Chief Complaint and HPI: [name] is a 47 y.o. female here today to re-establish care(she hasn't been seen here in a couple of years) and for a follow up visit After being seen in the ED 11/14/2017 for L sided- CP.  EKG showed no acute findings.  CP has resolved.  She was prescribed Zantac and amlodipine in the ED and told to f/up here.    She c/o intermittent HA for about 3 weeks.  Sometimes has dizziness too.  Some nausea over the last few days. Eyes have felt heavy.  HA is frontal and temporal B.  No vision changes.  No photophobia.  No thunderclap.    She also has had irregular periods with only 2 periods over the last year.    "Onalee Hua" with [employer] translators interpreting.  ED/Hospital notes reviewed.    Family history:  No early cardiac death  ROS:   Constitutional:  No f/c, No night sweats, No unexplained weight loss. EENT:  No vision changes, No blurry vision, No hearing changes. No mouth, throat, or ear problems.  Respiratory: No cough, No SOB Cardiac: No CP, no palpitations GI:  No abd pain, No N/V/D. GU: No Urinary s/sx Musculoskeletal: No joint pain Neuro: + headache, + dizziness, no motor weakness.  Skin: No rash Endocrine:  No polydipsia. No polyuria.  Psych: Denies SI/HI  No problems updated.  ALLERGIES: No Known Allergies  PAST MEDICAL HISTORY: Past Medical History:  Diagnosis Date  . Hypertension    2011    MEDICATIONS AT HOME: Prior to Admission medications   Medication Sig Start Date End Date Taking? Authorizing Provider  amLODipine (NORVASC) 5 MG tablet Take 1 tablet (5 mg total) by mouth daily. 11/14/17  Yes Wallis Bamberg, PA-C  ranitidine (ZANTAC) 150 MG tablet Take 1 tablet (150 mg total) by mouth 2 (two) times daily. 11/14/17  Yes  Wallis Bamberg, PA-C     Objective:  EXAM:   Vitals:   11/24/17 1547  BP: 132/81  Pulse: 92  Resp: 16  Temp: 97.9 F (36.6 C)  TempSrc: Oral  SpO2: 98%  Weight: 189 lb 3.2 oz (85.8 kg)  Height: 5' 1.02" (1.55 m)    General appearance : A&OX3. NAD. Non-toxic-appearing HEENT: Atraumatic and Normocephalic.  PERRLA. EOM intact.  TM clear B. Mouth-MMM, post pharynx WNL w/o erythema, No PND. Neck: supple, no JVD. No cervical lymphadenopathy. No thyromegaly Chest/Lungs:  Breathing-non-labored, Good air entry bilaterally, breath sounds normal without rales, rhonchi, or wheezing  CVS: S1 S2 regular, no murmurs, gallops, rubs  Extremities: Bilateral Lower Ext shows no edema, both legs are warm to touch with = pulse throughout Neurology:  CN II-XII grossly intact, Non focal.  Normal finger to nose; heel to shin Psych:  TP linear. J/I WNL. Normal speech. Appropriate eye contact and affect.  Skin:  No Rash  Data Review No results found for: HGBA1C   Assessment & Plan   1. Nonintractable headache, unspecified chronicity pattern, unspecified headache type Likely due to recent dx with htn and BP now starting to normalize. There are no red flags and HA goes away with tylenol or ibuprofen - TSH - Vitamin D, 25-hydroxy  2. Hypertension, unspecified type Almost at goal/much improved.  Continue current regimen of 5mg  amlodipine daily - Comprehensive  metabolic panel - CBC with Differential/Platelet  3. Irregular periods - TSH  4. Gastroesophageal reflux disease without esophagitis Continue zantac - H. pylori breath test  5. Encounter for examination following treatment at hospital Improved.  Patient have been counseled extensively about nutrition and exercise  Return in about 4 weeks (around 12/22/2017) for assign new PCP; BP and irregular periods follow-up.  The patient was given clear instructions to go to ER or return to medical center if symptoms don't improve, worsen or new  problems develop. The patient verbalized understanding. The patient was told to call to get lab results if they haven't heard anything in the next week.     Georgian CoAngela Kada Friesen, PA-C [facility] [address] [place], [place] [phone]   11/24/2017, 4:10 PM

## 2017-11-24 ENCOUNTER — Ambulatory Visit: Payer: Self-pay | Attending: Internal Medicine | Admitting: Physician Assistant

## 2017-11-24 VITALS — BP 132/81 | HR 92 | Temp 97.9°F | Resp 16 | Ht 61.02 in | Wt 189.2 lb

## 2017-11-24 DIAGNOSIS — R51 Headache: Secondary | ICD-10-CM | POA: Insufficient documentation

## 2017-11-24 DIAGNOSIS — Z79899 Other long term (current) drug therapy: Secondary | ICD-10-CM | POA: Insufficient documentation

## 2017-11-24 DIAGNOSIS — Z09 Encounter for follow-up examination after completed treatment for conditions other than malignant neoplasm: Secondary | ICD-10-CM

## 2017-11-24 DIAGNOSIS — R519 Headache, unspecified: Secondary | ICD-10-CM

## 2017-11-24 DIAGNOSIS — N926 Irregular menstruation, unspecified: Secondary | ICD-10-CM | POA: Insufficient documentation

## 2017-11-24 DIAGNOSIS — I10 Essential (primary) hypertension: Secondary | ICD-10-CM | POA: Insufficient documentation

## 2017-11-24 DIAGNOSIS — K219 Gastro-esophageal reflux disease without esophagitis: Secondary | ICD-10-CM | POA: Insufficient documentation

## 2017-11-24 NOTE — Progress Notes (Signed)
Patient is here for a hospital follow-up. Patient stated her head hurts. Patient stated her hurts earlier where she wanted to throw up. Patient took aspirin for headache relieves.

## 2017-11-25 LAB — CBC WITH DIFFERENTIAL/PLATELET
Basophils Absolute: 0 10*3/uL (ref 0.0–0.2)
Basos: 0 %
EOS (ABSOLUTE): 0.3 10*3/uL (ref 0.0–0.4)
EOS: 3 %
HEMOGLOBIN: 13.8 g/dL (ref 11.1–15.9)
Hematocrit: 40.2 % (ref 34.0–46.6)
IMMATURE GRANULOCYTES: 0 %
Immature Grans (Abs): 0 10*3/uL (ref 0.0–0.1)
Lymphocytes Absolute: 3.7 10*3/uL — ABNORMAL HIGH (ref 0.7–3.1)
Lymphs: 39 %
MCH: 28.8 pg (ref 26.6–33.0)
MCHC: 34.3 g/dL (ref 31.5–35.7)
MCV: 84 fL (ref 79–97)
MONOCYTES: 7 %
MONOS ABS: 0.7 10*3/uL (ref 0.1–0.9)
Neutrophils Absolute: 4.9 10*3/uL (ref 1.4–7.0)
Neutrophils: 51 %
Platelets: 273 10*3/uL (ref 150–379)
RBC: 4.79 x10E6/uL (ref 3.77–5.28)
RDW: 13.4 % (ref 12.3–15.4)
WBC: 9.6 10*3/uL (ref 3.4–10.8)

## 2017-11-25 LAB — COMPREHENSIVE METABOLIC PANEL
ALT: 60 IU/L — AB (ref 0–32)
AST: 49 IU/L — ABNORMAL HIGH (ref 0–40)
Albumin/Globulin Ratio: 1.2 (ref 1.2–2.2)
Albumin: 4.7 g/dL (ref 3.5–5.5)
Alkaline Phosphatase: 130 IU/L — ABNORMAL HIGH (ref 39–117)
BILIRUBIN TOTAL: 0.2 mg/dL (ref 0.0–1.2)
BUN / CREAT RATIO: 14 (ref 9–23)
BUN: 8 mg/dL (ref 6–24)
CO2: 23 mmol/L (ref 20–29)
CREATININE: 0.57 mg/dL (ref 0.57–1.00)
Calcium: 9.5 mg/dL (ref 8.7–10.2)
Chloride: 101 mmol/L (ref 96–106)
GFR calc Af Amer: 129 mL/min/{1.73_m2} (ref >59)
GFR calc non Af Amer: 112 mL/min/{1.73_m2} (ref >59)
GLUCOSE: 90 mg/dL (ref 65–99)
Globulin, Total: 3.8 g/dL (ref 1.5–4.5)
Potassium: 3.8 mmol/L (ref 3.5–5.2)
Sodium: 143 mmol/L (ref 134–144)
Total Protein: 8.5 g/dL (ref 6.0–8.5)

## 2017-11-25 LAB — VITAMIN D 25 HYDROXY (VIT D DEFICIENCY, FRACTURES): VIT D 25 HYDROXY: 26.5 ng/mL — AB (ref 30.0–100.0)

## 2017-11-25 LAB — TSH: TSH: 1.44 u[IU]/mL (ref 0.450–4.500)

## 2017-11-26 LAB — H. PYLORI BREATH TEST: H pylori Breath Test: POSITIVE — AB

## 2017-11-28 ENCOUNTER — Telehealth: Payer: Self-pay

## 2017-11-28 ENCOUNTER — Other Ambulatory Visit: Payer: Self-pay | Admitting: Physician Assistant

## 2017-11-28 MED ORDER — AMOXICILLIN 500 MG PO CAPS: 1000.0000 mg | ORAL_CAPSULE | Freq: Two times a day (BID) | ORAL | 0 refills | Status: DC

## 2017-11-28 MED ORDER — CLARITHROMYCIN 500 MG PO TABS: 500.0000 mg | ORAL_TABLET | Freq: Two times a day (BID) | ORAL | 0 refills | Status: DC

## 2017-11-28 MED ORDER — OMEPRAZOLE 20 MG PO CPDR: 20.0000 mg | DELAYED_RELEASE_CAPSULE | Freq: Two times a day (BID) | ORAL | 3 refills | Status: DC

## 2017-11-28 MED FILL — AMOXICILLIN 500 MG CAPSULE: 500 | 10 days supply | Qty: 40 | Fill #0

## 2017-11-28 MED FILL — CLARITHROMYCIN 500 MG TAB: 500 | 10 days supply | Qty: 20 | Fill #0

## 2017-11-28 MED FILL — ?OMEPRAZOLE DR 20MG CAPSULE: 20 | 15 days supply | Qty: 30 | Fill #0

## 2017-11-28 NOTE — Telephone Encounter (Signed)
Pt. returned nurse call. Please f/u

## 2017-11-28 NOTE — Telephone Encounter (Signed)
CMA called patient informing lab result.   Spanish interpreter Lars MageJuan [phone] interpret lab result to patient.  Patient understood and aware to pick [facility].

## 2017-11-28 NOTE — Telephone Encounter (Signed)
-----   Message from Anders SimmondsAngela M McClung, [place] JerseyPA-C sent at 11/28/2017  6:46 AM EST ----- Please call patient.  Her vitamin D is a little low.  She should get some vitamin D and take 2000 units daily.  She also tested positive for the bacteria that causes stomach ulcers which likely explains the problems she has been having.  I have sent her 3 prescriptions to our pharmacy(they will be cheaper here).  Her other labs are ok. Thanks, Georgian CoAngela McClung, PA-C

## 2017-11-28 NOTE — Telephone Encounter (Signed)
-----   Message from Angela M McClung, PA-C sent at 11/28/2017  6:46 AM EST ----- Please call patient.  Her vitamin D is a little low.  She should get some vitamin D and take 2000 units daily.  She also tested positive for the bacteria that causes stomach ulcers which likely explains the problems she has been having.  I have sent her 3 prescriptions to our pharmacy(they will be cheaper here).  Her other labs are ok. Thanks, Angela McClung, PA-C 

## 2017-12-21 ENCOUNTER — Ambulatory Visit: Payer: Self-pay | Attending: Internal Medicine

## 2017-12-23 ENCOUNTER — Ambulatory Visit: Payer: Self-pay | Attending: Nurse Practitioner | Admitting: Nurse Practitioner

## 2017-12-23 ENCOUNTER — Ambulatory Visit: Payer: Self-pay

## 2017-12-23 ENCOUNTER — Encounter: Payer: Self-pay | Admitting: Nurse Practitioner

## 2017-12-23 VITALS — BP 114/76 | HR 93 | Temp 98.7°F | Ht 61.0 in | Wt 187.0 lb

## 2017-12-23 DIAGNOSIS — Z9889 Other specified postprocedural states: Secondary | ICD-10-CM | POA: Insufficient documentation

## 2017-12-23 DIAGNOSIS — Z79899 Other long term (current) drug therapy: Secondary | ICD-10-CM | POA: Insufficient documentation

## 2017-12-23 DIAGNOSIS — Z9851 Tubal ligation status: Secondary | ICD-10-CM | POA: Insufficient documentation

## 2017-12-23 DIAGNOSIS — N912 Amenorrhea, unspecified: Secondary | ICD-10-CM | POA: Insufficient documentation

## 2017-12-23 DIAGNOSIS — Z6835 Body mass index (BMI) 35.0-35.9, adult: Secondary | ICD-10-CM | POA: Insufficient documentation

## 2017-12-23 DIAGNOSIS — Z833 Family history of diabetes mellitus: Secondary | ICD-10-CM | POA: Insufficient documentation

## 2017-12-23 DIAGNOSIS — M549 Dorsalgia, unspecified: Secondary | ICD-10-CM | POA: Insufficient documentation

## 2017-12-23 DIAGNOSIS — Z8249 Family history of ischemic heart disease and other diseases of the circulatory system: Secondary | ICD-10-CM | POA: Insufficient documentation

## 2017-12-23 DIAGNOSIS — E669 Obesity, unspecified: Secondary | ICD-10-CM | POA: Insufficient documentation

## 2017-12-23 DIAGNOSIS — I10 Essential (primary) hypertension: Secondary | ICD-10-CM | POA: Insufficient documentation

## 2017-12-23 DIAGNOSIS — Z23 Encounter for immunization: Secondary | ICD-10-CM | POA: Insufficient documentation

## 2017-12-23 MED ORDER — AMLODIPINE BESYLATE 5 MG PO TABS: 5.0000 mg | ORAL_TABLET | Freq: Every day | ORAL | 0 refills | Status: DC

## 2017-12-23 NOTE — Progress Notes (Signed)
Assessment & Plan:  [name] was seen today for establish care and back pain.  Diagnoses and all orders for this visit:  Essential hypertension -     amLODipine (NORVASC) 5 MG tablet; Take 1 tablet (5 mg total) by mouth daily.  Continue all antihypertensives as prescribed.  Remember to bring in your blood pressure log with you for your follow up appointment.  DASH/Mediterranean Diets are healthier choices for HTN.    Obesity (BMI 30-39.9) Discussed diet and exercise for person with BMI >25. Instructed: You must burn more calories than you eat. Losing 5 percent of your body weight should be considered a success. In the longer term, losing more than 15 percent of your body weight and staying at this weight is an extremely good result. However, keep in mind that even losing 5 percent of your body weight leads to important health benefits, so try not to get discouraged if you're not able to lose more than this. Will recheck weight in 3-6 months.  Amenorrhea -     TSH+Prl+FSH+TestT+LH+DHEA S... Will perform PAP at next office visit  Immunization due -     Flu Vaccine QUAD 6+ mos PF IM (Fluarix Quad PF)      Patient has been counseled on age-appropriate routine health concerns for screening and prevention. These are reviewed and up-to-date. Referrals have been placed accordingly. Immunizations are up-to-date or declined.    Subjective:   Chief Complaint  Patient presents with  . Establish Care    Patient is here to establish care for hypertension and irregular periods. Patient stated her head are much better and no pain.   . Back Pain    Patient sometimes have back pain but only when she's tired.    HPI  VRI was used to communicate directly with patient for the entire encounter including providing detailed patient instructions.  [name] 47 y.o. female presents to office today to establish care. She has a history of hypertension and would lie to discuss  metrorrhagia. [facility] Endorses medication compliance and completion of treatment regimen.   Essential Hypertension Chronic. Stable. Normotensive today. She endorses medication compliance. She takes norvasc daily around lunchtime. She endorses dizziness the first 2 weeks of taking norvasc. Dizziness has now subsided. Denies chest pain, shortness of breath, palpitations, lightheadedness, dizziness, headaches or BLE edema. She is walking more: up to 3 days a week and 40 minutes at a time and eating healthier.  BP Readings from Last 3 Encounters:  12/23/17 114/76  11/24/17 132/81  11/14/17 (!) 143/87   Dysmenorrhea/ Premenstrual Syndrome Patient complains of menstrual symptoms: Metrorrhagia. Symptoms began 1 year ago. She endorses only having a menstrual cycle February 2018 and December 2018. She had no menstrual cycle in between February and December. She currently has not had a menstrual cycle since December 2018. She endorses a normal flow to her menstrual cycle in both February and December. She had "staples" or tubal ligation in January 2012. She does endorse a history of metrorrhagia in the past as well as infertility. She denies any current abdominal pain. She is not taking any hormonal medicine.   Review of Systems  Constitutional: Negative for fever, malaise/fatigue and weight loss.  HENT: Negative.  Negative for nosebleeds.   Eyes: Negative.  Negative for blurred vision, double vision and photophobia.  Respiratory: Negative.  Negative for cough and shortness of breath.   Cardiovascular: Negative.  Negative for chest pain, palpitations and leg swelling.  Gastrointestinal: Negative.  Negative for abdominal pain, constipation, diarrhea, heartburn, nausea and vomiting.  Genitourinary:       SEE HPI  Musculoskeletal: Negative.  Negative for myalgias.  Neurological: Negative.  Negative for dizziness, focal weakness, seizures and headaches.    Endo/Heme/Allergies: Negative for environmental allergies.  Psychiatric/Behavioral: Negative.  Negative for suicidal ideas.    Past Medical History:  Diagnosis Date  . Hypertension    2011    Past Surgical History:  Procedure Laterality Date  . CESAREAN SECTION     2011  . ECTOPIC PREGNANCY SURGERY  1994   . TUBAL LIGATION     2011    Family History  Problem Relation Age of Onset  . Diabetes Mother   . Hypertension Mother   . Cancer Neg Hx   . Heart disease Neg Hx   . Kidney disease Neg Hx   . Depression Neg Hx     Social History Reviewed with no changes to be made today.   Outpatient Medications Prior to Visit  Medication Sig Dispense Refill  . Cyanocobalamin (VITAMIN B 12 PO) Take by mouth.    . MULTIPLE VITAMINS ESSENTIAL PO Take by mouth.    Marland Kitchen amLODipine (NORVASC) 5 MG tablet Take 1 tablet (5 mg total) by mouth daily. 90 tablet 0  . omeprazole (PRILOSEC) 20 MG capsule Take 1 capsule (20 mg total) by mouth 2 (two) times daily before a meal. X 69month then 1 daily for 2 months 30 capsule 3  . amoxicillin (AMOXIL) 500 MG capsule Take 2 capsules (1,000 mg total) by mouth 2 (two) times daily. (Patient not taking: Reported on 12/23/2017) 40 capsule 0  . clarithromycin (BIAXIN) 500 MG tablet Take 1 tablet (500 mg total) by mouth 2 (two) times daily. (Patient not taking: Reported on 12/23/2017) 20 tablet 0  . ranitidine (ZANTAC) 150 MG tablet Take 1 tablet (150 mg total) by mouth 2 (two) times daily. (Patient not taking: Reported on 12/23/2017) 60 tablet 0   No facility-administered medications prior to visit.     No Known Allergies     Objective:    BP 114/76 (BP Location: Left Arm, Patient Position: Sitting, Cuff Size: Normal)   Pulse 93   Temp 98.7 F (37.1 C) (Oral)   Ht 5\' 1"  (1.549 m)   Wt 187 lb (84.8 kg)   SpO2 95%   BMI 35.33 kg/m  Wt Readings from Last 3 Encounters:  12/23/17 187 lb (84.8 kg)  11/24/17 189 lb 3.2 oz (85.8 kg)  02/16/16 182 lb (82.6 kg)     Physical Exam  Constitutional: She is oriented to person, place, and time. She appears well-developed and well-nourished. She is cooperative.  HENT:  Head: Normocephalic and atraumatic.  Eyes: EOM are normal.  Neck: Normal range of motion.  Cardiovascular: Normal rate, regular rhythm, normal heart sounds and intact distal pulses. Exam reveals no gallop and no friction rub.  No murmur heard. Pulmonary/Chest: Effort normal and breath sounds normal. No tachypnea. No respiratory distress. She has no decreased breath sounds. She has no wheezes. She has no rhonchi. She has no rales. She exhibits no tenderness.  Abdominal: Soft. Bowel sounds are normal. She exhibits no distension and no mass. There is no tenderness. There is no rebound and no guarding.  Musculoskeletal: Normal range of motion. She exhibits no edema.  Neurological: She is alert and oriented to person, place, and time. Coordination normal.  Skin: Skin is warm and dry.  Psychiatric: She has a  normal mood and affect. Her behavior is normal. Judgment and thought content normal.  Nursing note and vitals reviewed.      Patient has been counseled extensively about nutrition and exercise as well as the importance of adherence with medications and regular follow-up. The patient was given clear instructions to go to ER or return to medical center if symptoms don't improve, worsen or new problems develop. The patient verbalized understanding.   Follow-up: Return in about 4 weeks (around 01/20/2018) for pap smear.   Claiborne Rigg, FNP-BC [facility] [place], [place] [phone]   12/23/2017, 1:59 PM

## 2017-12-23 NOTE — Patient Instructions (Addendum)
Amenorrea secundaria  (Secondary Amenorrhea)  La amenorrea secundaria es la detención del flujo menstrual durante 3–6 meses en una mujer que previamente tenía sus períodos. Hay muchas causas posibles: La mayoría de las causas no son graves. Generalmente, al tratar el problema subyacente que causa la detención de la menstruación, podrá volver a tener sus períodos normales.  CAUSAS  Algunas causas comunes de la falta de menstruación son:  · Desnutrición.  · Bajo nivel de azúcar en la sangre (hipoglucemia).  · Enfermedad poliquística de los ovarios.  · Estrés o miedos.  · Lactancia materna.  · Desequilibrio hormonal.  · Insuficiencia ovárica.  · Medicamentos.  · Obesidad extrema.  · Fibrosis quística.  · Reducción de peso drástica por cualquier causa.  · Menopausia precoz.  · Extirpación de los ovarios o del útero.  · Anticonceptivos.  · Enfermedades.  · Enfermedades de larga duración (crónicas).  · Síndrome de [place].  · Problemas de tiroides.  · Píldoras, parches o anillos vaginales para el control de la natalidad.  FACTORES DE RIESGO  Puede tener más riesgo de amenorrea secundaria si:  · Tiene una historia familiar de este problema.  · Sufre un trastorno alimentario.  · Realiza entrenamiento deportivo.  DIAGNÓSTICO  Este diagnóstico la realiza el médico por medio de la historia clínica y el examen físico. Incluirá un examen pélvico para verificar si hay problemas en los órganos reproductores. Debe descartarse la posibilidad de embarazo. Generalmente se indicarán diferentes análisis de sangre para medir diferentes tipos de hormonas en el organismo. Le indicarán análisis de orina. Le harán algunos estudios especializados (ecografías, tomografía computada, resonancia magnética o histeroscopía) y también medirán su índice de masa corporal (IMC).  TRATAMIENTO  El tratamiento depende de la causa de la amenorrea. Si hay un trastorno de la alimentación, deberá tratarse con la dieta y la terapia adecuadas. Los  trastornos crónicos pueden mejorar con el tratamiento de la enfermedad. La amenorrea puede corregirse con medicamentos, cambios en el estilo de vida o con cirugía. Si la amenorrea no puede corregirse, algunas veces es posible crear una falsa menstruación con medicamentos.  INSTRUCCIONES PARA EL CUIDADO EN EL HOGAR  · Consuma una dieta saludable.  · Controle los problemas de peso.  · Haga ejercicios con regularidad, pero no excesivamente.  · Duerma lo suficiente.  · Controle el estrés.  · Observe si hay cambios en el ciclo menstrual. Mantenga un registro del momento en que ocurren los períodos. Anote la fecha de inicio de los períodos, cuánto duran y si hay problemas.  SOLICITE ATENCIÓN MÉDICA SI:  Los síntomas no mejoran con el tratamiento.  Esta información no tiene como fin reemplazar el consejo del médico. Asegúrese de hacerle al médico cualquier pregunta que tenga.  Document Released: 06/27/2013 Document Revised: 11/15/2014 Document Reviewed: 04/12/2013  Elsevier Interactive Patient Education © 2018 [employer] Inc[employer]

## 2017-12-27 ENCOUNTER — Telehealth: Payer: Self-pay

## 2017-12-27 LAB — TSH+PRL+FSH+TESTT+LH+DHEA S...
17-Hydroxyprogesterone: 25 ng/dL
DHEA-SO4: 148.2 ug/dL (ref 41.2–243.7)
FSH: 23.6 m[IU]/mL
LH: 15.1 m[IU]/mL
Prolactin: 8.5 ng/mL (ref 4.8–23.3)
TESTOSTERONE FREE: 0.7 pg/mL (ref 0.0–4.2)
TSH: 1.47 u[IU]/mL (ref 0.450–4.500)
Testosterone: 11 ng/dL (ref 8–48)

## 2017-12-27 NOTE — Telephone Encounter (Signed)
-----   Message from Claiborne RiggZelda W Fleming, NP sent at 12/24/2017  2:19 PM EST ----- Labs do not show significant abnormalities. You may likely be premenopausal. You also have a history of irregular periods. Will schedule for PAP smear.

## 2017-12-27 NOTE — Telephone Encounter (Signed)
CMA spoke to patient to inform on lab results.  Patient understood and is aware Pap smear for her next OV.   Spanish Interpreter Era BumpersLorena [phone] assisted with the call.

## 2018-01-18 ENCOUNTER — Encounter: Payer: Self-pay | Admitting: Nurse Practitioner

## 2018-01-18 ENCOUNTER — Telehealth: Payer: Self-pay | Admitting: Nurse Practitioner

## 2018-01-18 ENCOUNTER — Ambulatory Visit: Payer: Self-pay | Attending: Nurse Practitioner | Admitting: Nurse Practitioner

## 2018-01-18 VITALS — BP 121/77 | HR 79 | Temp 98.3°F | Ht 61.0 in | Wt 184.6 lb

## 2018-01-18 DIAGNOSIS — Z9889 Other specified postprocedural states: Secondary | ICD-10-CM | POA: Insufficient documentation

## 2018-01-18 DIAGNOSIS — Z124 Encounter for screening for malignant neoplasm of cervix: Secondary | ICD-10-CM

## 2018-01-18 DIAGNOSIS — Z79899 Other long term (current) drug therapy: Secondary | ICD-10-CM | POA: Insufficient documentation

## 2018-01-18 DIAGNOSIS — R896 Abnormal cytological findings in specimens from other organs, systems and tissues: Secondary | ICD-10-CM | POA: Insufficient documentation

## 2018-01-18 DIAGNOSIS — K649 Unspecified hemorrhoids: Secondary | ICD-10-CM | POA: Insufficient documentation

## 2018-01-18 DIAGNOSIS — I10 Essential (primary) hypertension: Secondary | ICD-10-CM | POA: Insufficient documentation

## 2018-01-18 DIAGNOSIS — Z9851 Tubal ligation status: Secondary | ICD-10-CM | POA: Insufficient documentation

## 2018-01-18 NOTE — Telephone Encounter (Signed)
Pt called to request he CAFA letter be sent over to her address, she

## 2018-01-18 NOTE — Progress Notes (Signed)
Assessment & Plan:  [name] was seen today for abnormal pap smear and hemorrhoids.  Diagnoses and all orders for this visit:  Pap smear for cervical cancer screening -     Cytology - PAP   Patient has been counseled on age-appropriate routine health concerns for screening and prevention. These are reviewed and up-to-date. Referrals have been placed accordingly. Immunizations are up-to-date or declined.    Subjective:   Chief Complaint  Patient presents with  . Abnormal Pap Smear  . Hemorrhoids    Pt's stated she have a hemorrhoids and it give her discomfort when she use the restroom.    HPI [name] 47 y.o. female presents to office today for PAP smear. She denies any symptoms of vaginitis or UTI symptoms.     Review of Systems  Constitutional: Negative.  Negative for chills, fever, malaise/fatigue and weight loss.  Respiratory: Negative.  Negative for cough, shortness of breath and wheezing.   Cardiovascular: Negative.  Negative for chest pain, orthopnea and leg swelling.  Gastrointestinal: Negative for abdominal pain.  Genitourinary: Negative for dysuria, flank pain, frequency, hematuria and urgency.  Skin: Negative.  Negative for rash.  Psychiatric/Behavioral: Negative for suicidal ideas.    Past Medical History:  Diagnosis Date  . Hypertension    2011    Past Surgical History:  Procedure Laterality Date  . CESAREAN SECTION     2011  . ECTOPIC PREGNANCY SURGERY  1994   . TUBAL LIGATION     2011    Family History  Problem Relation Age of Onset  . Diabetes Mother   . Hypertension Mother   . Cancer Neg Hx   . Heart disease Neg Hx   . Kidney disease Neg Hx   . Depression Neg Hx     Social History Reviewed with no changes to be made today.   Outpatient Medications Prior to Visit  Medication Sig Dispense Refill  . amLODipine (NORVASC) 5 MG tablet Take 1 tablet (5 mg total) by mouth daily. 90 tablet 0  . Cyanocobalamin (VITAMIN B 12  PO) Take by mouth.    . MULTIPLE VITAMINS ESSENTIAL PO Take by mouth.     No facility-administered medications prior to visit.     No Known Allergies     Objective:    BP 121/77 (BP Location: Left Arm, Patient Position: Sitting, Cuff Size: Normal)   Pulse 79   Temp 98.3 F (36.8 C) (Oral)   Ht 5\' 1"  (1.549 m)   Wt 184 lb 9.6 oz (83.7 kg)   SpO2 98%   BMI 34.88 kg/m  Wt Readings from Last 3 Encounters:  01/18/18 184 lb 9.6 oz (83.7 kg)  12/23/17 187 lb (84.8 kg)  11/24/17 189 lb 3.2 oz (85.8 kg)    Physical Exam  Constitutional: She is oriented to person, place, and time. She appears well-developed and well-nourished.  HENT:  Head: Normocephalic.  Cardiovascular: Normal rate, regular rhythm and normal heart sounds.  Pulmonary/Chest: Effort normal and breath sounds normal.  Abdominal: Soft. Bowel sounds are normal. Hernia confirmed negative in the right inguinal area and confirmed negative in the left inguinal area.  Genitourinary: Vagina normal and uterus normal. No vaginal discharge found.  Lymphadenopathy:       Right: No inguinal adenopathy present.       Left: No inguinal adenopathy present.  Neurological: She is alert and oriented to person, place, and time.  Skin: Skin is warm and dry.  Psychiatric: She has a  normal mood and affect. Her behavior is normal. Judgment and thought content normal.       Patient has been counseled extensively about nutrition and exercise as well as the importance of adherence with medications and regular follow-up. The patient was given clear instructions to go to ER or return to medical center if symptoms don't improve, worsen or new problems develop. The patient verbalized understanding.   Follow-up: Return in about 2 weeks (around 02/01/2018) for f/u MOOD.   Claiborne RiggZelda W Chantea Surace, FNP-BC [facility] [place], [place] [phone]   01/18/2018, 9:46 PM

## 2018-01-18 NOTE — Patient Instructions (Addendum)

## 2018-01-24 LAB — CYTOLOGY - PAP
ADEQUACY: ABSENT
DIAGNOSIS: NEGATIVE
HPV (WINDOPATH): NOT DETECTED

## 2018-01-26 ENCOUNTER — Telehealth: Payer: Self-pay

## 2018-01-26 NOTE — Telephone Encounter (Signed)
CMA spoke to patient to inform on lab results.  Patient understood. Verified DOB.  Spanish interpreter Mikle BosworthCarlos [phone] assist with the call.

## 2018-01-26 NOTE — Telephone Encounter (Signed)
-----   Message from Claiborne RiggZelda W Fleming, NP sent at 01/24/2018  9:38 AM EDT ----- PAP was normal. Next PAP 2022

## 2018-02-14 ENCOUNTER — Ambulatory Visit: Payer: Self-pay | Attending: Nurse Practitioner | Admitting: Nurse Practitioner

## 2018-02-14 ENCOUNTER — Encounter: Payer: Self-pay | Admitting: Nurse Practitioner

## 2018-02-14 VITALS — BP 129/87 | HR 75 | Temp 99.0°F | Ht 61.0 in | Wt 190.0 lb

## 2018-02-14 DIAGNOSIS — K644 Residual hemorrhoidal skin tags: Secondary | ICD-10-CM | POA: Insufficient documentation

## 2018-02-14 DIAGNOSIS — Z833 Family history of diabetes mellitus: Secondary | ICD-10-CM | POA: Insufficient documentation

## 2018-02-14 DIAGNOSIS — K219 Gastro-esophageal reflux disease without esophagitis: Secondary | ICD-10-CM | POA: Insufficient documentation

## 2018-02-14 DIAGNOSIS — Z9851 Tubal ligation status: Secondary | ICD-10-CM | POA: Insufficient documentation

## 2018-02-14 DIAGNOSIS — Z79899 Other long term (current) drug therapy: Secondary | ICD-10-CM | POA: Insufficient documentation

## 2018-02-14 DIAGNOSIS — Z09 Encounter for follow-up examination after completed treatment for conditions other than malignant neoplasm: Secondary | ICD-10-CM | POA: Insufficient documentation

## 2018-02-14 DIAGNOSIS — I10 Essential (primary) hypertension: Secondary | ICD-10-CM | POA: Insufficient documentation

## 2018-02-14 DIAGNOSIS — Z9889 Other specified postprocedural states: Secondary | ICD-10-CM | POA: Insufficient documentation

## 2018-02-14 DIAGNOSIS — Z8249 Family history of ischemic heart disease and other diseases of the circulatory system: Secondary | ICD-10-CM | POA: Insufficient documentation

## 2018-02-14 DIAGNOSIS — K5900 Constipation, unspecified: Secondary | ICD-10-CM | POA: Insufficient documentation

## 2018-02-14 MED ORDER — DOCUSATE SODIUM 100 MG PO CAPS: 100.0000 mg | ORAL_CAPSULE | Freq: Every day | ORAL | 1 refills | Status: AC

## 2018-02-14 MED ORDER — AMLODIPINE BESYLATE 5 MG PO TABS: 5.0000 mg | ORAL_TABLET | Freq: Every day | ORAL | 0 refills | Status: DC

## 2018-02-14 MED ORDER — DOCUSATE SODIUM 100 MG PO CAPS: 100.0000 mg | ORAL_CAPSULE | Freq: Every day | ORAL | 1 refills | Status: DC

## 2018-02-14 MED ORDER — AMLODIPINE BESYLATE 5 MG PO TABS
5.0000 mg | ORAL_TABLET | Freq: Every day | ORAL | 0 refills | Status: DC
Start: 2018-02-14 — End: 2018-10-18

## 2018-02-14 MED ORDER — HYDROCORTISONE 2.5 % RE CREA: 1.0000 "application " | TOPICAL_CREAM | Freq: Two times a day (BID) | RECTAL | 0 refills | Status: DC

## 2018-02-14 NOTE — Patient Instructions (Addendum)
Estreimiento en los adultos Constipation, Adult Se llama estreimiento cuando:  Tiene deposiciones (defeca) una menor cantidad de veces a la semana de lo normal.  Tiene dificultad para defecar.  Las heces son secas y duras o son ms grandes que lo normal.  Siga estas indicaciones en su casa: Comida y bebida   Consuma alimentos con alto contenido de [place]fibra, por ejemplo: ? Nils PyleFrutas y verduras frescas. ? Cereales integrales. ? Frijoles.  Consuma una menor cantidad de alimentos ricos en grasas, con bajo contenido de [place]fibra o excesivamente procesados, como: ? Papas fritas. ? Hamburguesas. ? Galletas. ? Caramelos. ? Gaseosas.  Beba suficiente lquido para mantener el pis (orina) claro o de color amarillo plido. Instrucciones generales  Haga actividad fsica con regularidad o segn las indicaciones del mdico.  Vaya al bao cuando sienta la necesidad de defecar. No se aguante las ganas.  Tome los medicamentos de venta libre y los recetados solamente como se lo haya indicado el mdico. Estos incluyen los suplementos de Boodyfibra.  Realice ejercicios de reentrenamiento del suelo plvico, como: ? Respirar profundamente mientras relaja la parte inferior del vientre (abdomen). ? Relajar el suelo plvico mientras defeca.  Controle su afeccin para ver si hay cambios.  Concurra a todas las visitas de control como se lo haya indicado el mdico. Esto es importante. Comunquese con un mdico si:  Siente un dolor que empeora.  Tiene fiebre.  No ha defecado por 4das.  Vomita.  No tiene hambre.  Pierde peso.  Tiene una hemorragia en el ano.  Las deposiciones Charity fundraiser(heces) son delgadas como un lpiz. Solicite ayuda de inmediato si:  Lance Mussiene fiebre, y los sntomas empeoran de repente.  Tiene prdida de materia fecal u observa Bank of New York Companysangre en las heces.  Siente el vientre ms duro o ms grande de lo normal (est hinchado).  Siente un dolor muy intenso en el vientre.  Se siente mareado o  se desmaya. Esta informacin no tiene Theme park managercomo fin reemplazar el consejo del mdico. Asegrese de hacerle al mdico cualquier pregunta que tenga. Document Released: 11/27/2010 Document Revised: 01/26/2017 Document Reviewed: 04/14/2016 Elsevier Interactive Patient Education  2018 [employer]  Hemorroides Hemorrhoids Las hemorroides son venas inflamadas adentro o alrededor del recto o del ano. Hay dos tipos de hemorroides:  Hemorroides internas. Se forman en las venas del interior del recto. Pueden abultarse hacia afuera, irritarse y doler.  Hemorroides externas. Se producen en las venas externas del ano y pueden sentirse como un bulto o zona hinchada, dura y dolorosa cerca del ano.  La mayora de las hemorroides no causan problemas graves y se Sports coachpueden controlar con tratamientos caseros Lubrizol Corporationcomo los cambios en la dieta y el estilo de vida. Si los tratamientos caseros no ayudan con los sntomas, se pueden Education officer, environmentalrealizar procedimientos para reducir o extirpar las hemorroides. Cules son las causas? La causa de esta afeccin es el aumento de la presin en la zona anal. Esta presin puede ser causada por distintos factores, por ejemplo:  Estreimiento.  Dificultad para defecar.  Diarrea.  Embarazo.  Obesidad.  Estar sentado durante largos perodos de Moselletiempo.  Levantar objetos pesados u otras actividades que impliquen esfuerzo.  Sexo anal.  Cules son los signos o los sntomas? Los sntomas de esta afeccin incluyen lo siguiente:  Engineer, miningDolor.  Picazn o irritacin anal.  Sangrado rectal.  Prdida de materia fecal (heces).  Inflamacin anal.  Uno o ms bultos alrededor del ano.  Cmo se diagnostica? Esta afeccin se diagnostica frecuentemente a travs de un examen visual. Posiblemente  le realicen otros tipos de pruebas o estudios, como los siguientes:  Examen del rea rectal con Neomia Dear mano enguantada (examen rectal digital).  Examen del canal anal utilizando un pequeo tubo  (anoscopio).  Anlisis de sangre si ha perdido [place] cantidad significativa de Lyman.  Un estudio para observar el interior del colon (sigmoidoscopa o colonoscopa).  Cmo se trata? Esta afeccin generalmente se puede tratar en el hogar. Se pueden realizar diversos procedimientos si los cambios en la dieta, en el estilo de vida y otros tratamientos caseros no [employer]. Estos procedimientos pueden ayudar a reducir o extirpar las hemorroides completamente. Algunos de estos procedimientos son quirrgicos y otros no. Algunos de los procedimientos ms frecuentes son los siguientes:  Ligadura con Curator. Las bandas elsticas se colocan en la base de las hemorroides para interrumpir la irrigacin de la [place].  Escleroterapia. Se inyecta un medicamento en las hemorroides para reducir su tamao.  Coagulacin con luz infrarroja. Se utiliza un tipo de energa lumnica para eliminar las hemorroides.  Hemorroidectoma. Las hemorroides se extirpan con [place] y las venas que las [place] se Web designer.  Hemorroidopexia con grapas. Se [place] un dispositivo tipo grapa de forma circular para extirpar las hemorroides y unas grapas para cortar la sangre que se irriga hacia las hemorroides.  Siga estas indicaciones en su casa: Qu debe comer y beber  Consuma alimentos con alto contenido de [place], como cereales integrales, porotos, frutos secos, frutas y verduras. Pregntele a su mdico acerca de tomar productos con fibra aadida en ellos (complementos de fibra).  Beba suficiente lquido para Photographer orina clara o de color amarillo plido. Control del dolor y de la hinchazn  Tome baos de asiento tibios durante 20 minutos, 3 o 4 veces por da para Primary school teacher y las [place].  Si se lo indican, aplique hielo en la zona afectada. Usar compresas de [employer] baos de asiento puede ser [place]. ? Ponga el hielo en una bolsa plstica. ? Coloque una [employer] piel y la bolsa de  hielo. ? Coloque el hielo durante , 2 a 3veces por da. Instrucciones generales  [employer] de venta libre y los recetados solamente como se lo haya indicado el mdico.  Aplquese los medicamentos, cremas o supositorios como se lo hayan indicado.  Haga ejercicios regularmente.  Vaya al bao cuando sienta la necesidad de defecar. No espere.  Evite hacer fuerza al defecar.  Mantenga la zona anal limpia y seca. Use papel higinico hmedo o toallitas humedecidas despus de defecar.  No pase mucho tiempo sentado en el inodoro. Esto aumenta la afluencia de sangre y Chief Technology Officer. Comunquese con un mdico si:  Aumenta el dolor y la hinchazn, y no puede controlarlos con los medicamentos o con Pharmacist, community.  Tiene una hemorragia que no Magazine features editor.  No puede defecar o lo hace con dificultad.  Siente dolor o tiene inflamacin fuera de la zona de las hemorroides. Esta informacin no tiene Theme park manager el consejo del mdico. Asegrese de hacerle al mdico cualquier pregunta que tenga. Document Released: 10/25/2005 Document Revised: 03/02/2017 Document Reviewed: 07/09/2015 Elsevier Interactive Patient Education  2018 Elsevier Inc.  Nonsurgical Procedures for Hemorrhoids, Care After Refer to this sheet in the next few weeks. These instructions provide you with information about caring for yourself after your procedure. Your health care provider may also give you more specific instructions. Your treatment has been planned according to current medical practices, but problems sometimes occur. Call  your health care provider if you have any problems or questions after your procedure. What can I expect after the procedure? After the procedure, it is common to have:  Slight rectal bleeding for a few days.  Soreness or a dull ache in the rectal area.  Follow these instructions at home: Medicines   Take over-the-counter and prescription medicines only as told by  your health care provider.  Use a stool softener or a bulk laxative as told by your health care provider. Activity  Return to your normal activities as told by your health care provider. Ask your health care provider what activities are safe for you.  Do not lift anything that is heavier than 10 lb (4.5 kg).  Do not sit for long periods of time. Take a walk every day or as told by your health care provider.  Do not strain to have a bowel movement.  Do not spend a long time sitting on the toilet. Eating and drinking   Eat foods that contain fiber, such as whole grains, beans, nuts, fruits, and vegetables.  Drink enough fluid to keep your urine clear or pale yellow. General instructions  Sit in a warm bath 2-3 times a day to relieve soreness or itching.  Keep all follow-up visits as told by your health care provider. This is important. Contact a health care provider if:  Your pain medicine is not helping.  You have a fever.  You become constipated.  You continue to have light rectal bleeding for more than a few days. Get help right away if:  You have very bad rectal pain.  You have heavy bleeding from your rectum. This information is not intended to replace advice given to you by your health care provider. Make sure you discuss any questions you have with your health care provider. Document Released: 11/21/2015 Document Revised: 04/01/2016 Document Reviewed: 01/20/2015 Elsevier Interactive Patient Education  [employer].

## 2018-02-14 NOTE — Progress Notes (Signed)
Assessment & Plan:  Dayannara was seen today for follow-up and gi problem.  Diagnoses and all orders for this visit:  Gastroesophageal reflux disease, esophagitis presence not specified INSTRUCTIONS: Avoid GERD Triggers: acidic, spicy or fried foods, caffeine, coffee, sodas,  alcohol and chocolate.   Constipation, unspecified constipation type -     docusate sodium (COLACE) 100 MG capsule; Take 1 capsule (100 mg total) by mouth daily. -     hydrocortisone (ANUSOL-HC) 2.5 % rectal cream; Place 1 application rectally 2 (two) times daily.  Essential hypertension -     amLODipine (NORVASC) 5 MG tablet; Take 1 tablet (5 mg total) by mouth daily. Continue all antihypertensives as prescribed.  Remember to bring in your blood pressure log with you for your follow up appointment.  DASH/Mediterranean Diets are healthier choices for HTN.     Patient has been counseled on age-appropriate routine health concerns for screening and prevention. These are reviewed and up-to-date. Referrals have been placed accordingly. Immunizations are up-to-date or declined.    Subjective:   Chief Complaint  Patient presents with  . Follow-up    Pt. is here for a follow-up on MOOD.  Marland Kitchen GI Problem    Pt. stated her stomach feel like acid since last Monday. Pt. also have constipation problem, she stated her hemorrhoid bleed when she is constipation.   HPI [name] 47 y.o. female presents to office today with concerns of GERD and constipation.    GERD Onset 1 week ago. Feels like increased acid production. She has been on zantac in the past which seemed to provide relief. She states she wasn't sure if she could restart it without seeing me so she has not resumed. I have instructed her to restart zantac and will follow up at next office visit.    Constipation Chronic. She picked up an OTC medication for constipation which did not seem to provide complete relief of her constipation and also  caused her external hemorrhoids to flare. She has a history of external hemorrhoids.  She has been using an OTC hemorrhoidal relief cream which has only provided little relief of pain. She is requesting a prescription hemorroidal cream for relief.   Depression and Anxiety Today she reports no symptoms of depression or anxiety.  Depression screen [facility] 2/9 01/18/2018  Decreased Interest 0  Down, Depressed, Hopeless 0  PHQ - 2 Score 0  Altered sleeping 1  Tired, decreased energy 0  Change in appetite 0  Feeling bad or failure about yourself  0  Trouble concentrating 0  Moving slowly or fidgety/restless 0  Suicidal thoughts 0  PHQ-9 Score 1    Review of Systems  Constitutional: Negative for fever, malaise/fatigue and weight loss.  HENT: Negative.  Negative for nosebleeds.   Eyes: Negative.  Negative for blurred vision, double vision and photophobia.  Respiratory: Negative.  Negative for cough and shortness of breath.   Cardiovascular: Negative.  Negative for chest pain, palpitations and leg swelling.  Gastrointestinal: Positive for constipation and heartburn. Negative for blood in stool, melena, nausea and vomiting.  Musculoskeletal: Negative.  Negative for myalgias.  Neurological: Negative.  Negative for dizziness, focal weakness, seizures and headaches.  Psychiatric/Behavioral: Negative.  Negative for suicidal ideas.    Past Medical History:  Diagnosis Date  . GERD (gastroesophageal reflux disease)   . Hypertension    2011    Past Surgical History:  Procedure Laterality Date  . CESAREAN SECTION     2011  . ECTOPIC PREGNANCY  SURGERY  1994   . TUBAL LIGATION     2011    Family History  Problem Relation Age of Onset  . Diabetes Mother   . Hypertension Mother   . Cancer Neg Hx   . Heart disease Neg Hx   . Kidney disease Neg Hx   . Depression Neg Hx     Social History Reviewed with no changes to be made today.   Outpatient Medications Prior to Visit  Medication Sig  Dispense Refill  . Cyanocobalamin (VITAMIN B 12 PO) Take by mouth.    . MULTIPLE VITAMINS ESSENTIAL PO Take by mouth.    Marland Kitchen. amLODipine (NORVASC) 5 MG tablet Take 1 tablet (5 mg total) by mouth daily. 90 tablet 0   No facility-administered medications prior to visit.     No Known Allergies     Objective:    BP 129/87 (BP Location: Left Arm, Patient Position: Sitting, Cuff Size: Normal)   Pulse 75   Temp 99 F (37.2 C) (Oral)   Ht 5\' 1"  (1.549 m)   Wt 190 lb (86.2 kg)   SpO2 97%   BMI 35.90 kg/m  Wt Readings from Last 3 Encounters:  02/14/18 190 lb (86.2 kg)  01/18/18 184 lb 9.6 oz (83.7 kg)  12/23/17 187 lb (84.8 kg)    Physical Exam  Constitutional: She is oriented to person, place, and time. She appears well-developed and well-nourished. She is cooperative.  HENT:  Head: Normocephalic and atraumatic.  Cardiovascular: Normal rate, regular rhythm and normal heart sounds. Exam reveals no gallop and no friction rub.  No murmur heard. Pulmonary/Chest: Effort normal and breath sounds normal. No tachypnea. No respiratory distress. She has no decreased breath sounds. She has no wheezes. She has no rhonchi. She has no rales. She exhibits no tenderness.  Abdominal: Soft. Bowel sounds are normal.  Musculoskeletal: Normal range of motion. She exhibits no edema.  Neurological: She is alert and oriented to person, place, and time. Coordination normal.  Skin: Skin is warm and dry.  Psychiatric: She has a normal mood and affect. Her behavior is normal. Judgment and thought content normal.  Nursing note and vitals reviewed.        Patient has been counseled extensively about nutrition and exercise as well as the importance of adherence with medications and regular follow-up. The patient was given clear instructions to go to ER or return to medical center if symptoms don't improve, worsen or new problems develop. The patient verbalized understanding.   Follow-up: Return if symptoms  worsen or fail to improve, for Physical ONLY no labs.   Claiborne RiggZelda W Fleming, FNP-BC [facility] [place], [place] [phone]   02/15/2018, 3:51 PM

## 2018-02-15 ENCOUNTER — Encounter: Payer: Self-pay | Admitting: Nurse Practitioner

## 2018-09-20 ENCOUNTER — Ambulatory Visit: Payer: Self-pay

## 2018-10-03 ENCOUNTER — Ambulatory Visit: Payer: Self-pay | Attending: Nurse Practitioner

## 2018-10-18 ENCOUNTER — Encounter: Payer: Self-pay | Admitting: Nurse Practitioner

## 2018-10-18 ENCOUNTER — Ambulatory Visit: Payer: Self-pay | Attending: Nurse Practitioner | Admitting: Nurse Practitioner

## 2018-10-18 ENCOUNTER — Other Ambulatory Visit: Payer: Self-pay

## 2018-10-18 VITALS — BP 115/81 | HR 84 | Temp 98.0°F | Resp 16 | Ht 61.0 in | Wt 192.6 lb

## 2018-10-18 DIAGNOSIS — R21 Rash and other nonspecific skin eruption: Secondary | ICD-10-CM

## 2018-10-18 DIAGNOSIS — Z8249 Family history of ischemic heart disease and other diseases of the circulatory system: Secondary | ICD-10-CM | POA: Insufficient documentation

## 2018-10-18 DIAGNOSIS — I10 Essential (primary) hypertension: Secondary | ICD-10-CM

## 2018-10-18 DIAGNOSIS — Z0000 Encounter for general adult medical examination without abnormal findings: Secondary | ICD-10-CM

## 2018-10-18 DIAGNOSIS — Z6836 Body mass index (BMI) 36.0-36.9, adult: Secondary | ICD-10-CM | POA: Insufficient documentation

## 2018-10-18 DIAGNOSIS — K219 Gastro-esophageal reflux disease without esophagitis: Secondary | ICD-10-CM | POA: Insufficient documentation

## 2018-10-18 DIAGNOSIS — Z79899 Other long term (current) drug therapy: Secondary | ICD-10-CM | POA: Insufficient documentation

## 2018-10-18 MED ORDER — TRIAMCINOLONE ACETONIDE 0.025 % EX OINT
1.0000 | TOPICAL_OINTMENT | Freq: Two times a day (BID) | CUTANEOUS | 1 refills | Status: DC
Start: 2018-10-18 — End: 2018-11-21

## 2018-10-18 MED ORDER — AMLODIPINE BESYLATE 5 MG PO TABS: 5.0000 mg | ORAL_TABLET | Freq: Every day | ORAL | 0 refills | Status: DC

## 2018-10-18 MED ORDER — HYDROCORTISONE 2.5 % RE CREA: 1.0000 "application " | TOPICAL_CREAM | Freq: Two times a day (BID) | RECTAL | 0 refills | Status: DC

## 2018-10-18 NOTE — Progress Notes (Signed)
Assessment & Plan:  [name] was seen today for annual exam.  Diagnoses and all orders for this visit:  Well woman exam without gynecological exam  Essential hypertension -     amLODipine (NORVASC) 5 MG tablet; Take 1 tablet (5 mg total) by mouth daily. Continue all antihypertensives as prescribed.  Remember to bring in your blood pressure log with you for your follow up appointment.  DASH/Mediterranean Diets are healthier choices for HTN.   SKIN RASH -     triamcinolone (KENALOG) 0.025 % ointment; Apply 1 application topically 2 (two) times daily. To bilateral ears    Patient has been counseled on age-appropriate routine health concerns for screening and prevention. These are reviewed and up-to-date. Referrals have been placed accordingly. Immunizations are up-to-date or declined.    Subjective:   Chief Complaint  Patient presents with  . Annual Exam   HPI [name] 47 y.o. female presents to office today for annual physical exam.    Essential Hypertension Well controlled. Endorses medication compliance taking amlodipine 5mg . She denies chest pain, shortness of breath, palpitations, lightheadedness, dizziness, headaches or BLE edema. We discussed dietary and exercise modifications today. She is considered morbidly obese with BMI >35.  BP Readings from Last 3 Encounters:  10/18/18 115/81  02/14/18 129/87  01/18/18 121/77   Review of Systems  Constitutional: Negative.  Negative for chills, fever, malaise/fatigue and weight loss.  HENT: Negative.  Negative for congestion, hearing loss, sinus pain and sore throat.   Eyes: Negative.  Negative for blurred vision, double vision, photophobia and pain.  Respiratory: Negative.  Negative for cough, sputum production, shortness of breath and wheezing.   Cardiovascular: Negative.  Negative for chest pain and leg swelling.  Gastrointestinal: Negative.  Negative for abdominal pain, constipation, diarrhea, heartburn,  nausea and vomiting.  Genitourinary: Negative.   Musculoskeletal: Negative.  Negative for joint pain and myalgias.  Skin: Positive for itching and rash.  Neurological: Negative.  Negative for dizziness, tremors, speech change, focal weakness, seizures and headaches.  Endo/Heme/Allergies: Negative.  Negative for environmental allergies.  Psychiatric/Behavioral: Negative.  Negative for depression and suicidal ideas. The patient is not nervous/anxious and does not have insomnia.     Past Medical History:  Diagnosis Date  . GERD (gastroesophageal reflux disease)   . Hypertension    2011    Past Surgical History:  Procedure Laterality Date  . CESAREAN SECTION     2011  . ECTOPIC PREGNANCY SURGERY  1994   . TUBAL LIGATION     2011    Family History  Problem Relation Age of Onset  . Diabetes Mother   . Hypertension Mother   . Cancer Neg Hx   . Heart disease Neg Hx   . Kidney disease Neg Hx   . Depression Neg Hx     Social History Reviewed with no changes to be made today.   Outpatient Medications Prior to Visit  Medication Sig Dispense Refill  . MULTIPLE VITAMINS ESSENTIAL PO Take by mouth.    Marland Kitchen amLODipine (NORVASC) 5 MG tablet Take 1 tablet (5 mg total) by mouth daily. 90 tablet 0  . Cyanocobalamin (VITAMIN B 12 PO) Take by mouth.    . hydrocortisone (ANUSOL-HC) 2.5 % rectal cream Place 1 application rectally 2 (two) times daily. 30 g 0   No facility-administered medications prior to visit.     No Known Allergies     Objective:    BP 115/81 (BP Location: Left Arm, Patient Position: Sitting,  Cuff Size: Normal)   Pulse 84   Temp 98 F (36.7 C) (Oral)   Resp 16   Ht 5\' 1"  (1.549 m)   Wt 192 lb 9.6 oz (87.4 kg)   LMP 10/18/2016   SpO2 98%   BMI 36.39 kg/m  Wt Readings from Last 3 Encounters:  10/18/18 192 lb 9.6 oz (87.4 kg)  02/14/18 190 lb (86.2 kg)  01/18/18 184 lb 9.6 oz (83.7 kg)    Physical Exam Constitutional:      Appearance: She is well-developed.   HENT:     Head: Normocephalic and atraumatic.     Right Ear: Hearing, tympanic membrane and ear canal normal.     Left Ear: Hearing, tympanic membrane and ear canal normal.     Ears:     Comments: Erythematous macular rash; posterior bilatearal auricles.    Nose: Nose normal.     Mouth/Throat:     Pharynx: No oropharyngeal exudate.  Eyes:     General: No scleral icterus.       Right eye: No discharge.     Conjunctiva/sclera: Conjunctivae normal.     Pupils: Pupils are equal, round, and reactive to light.  Neck:     Musculoskeletal: Normal range of motion and neck supple.     Thyroid: No thyromegaly.     Trachea: No tracheal deviation.  Cardiovascular:     Rate and Rhythm: Normal rate and regular rhythm.     Heart sounds: Normal heart sounds. No murmur. No friction rub.  Pulmonary:     Effort: Pulmonary effort is normal. No accessory muscle usage or respiratory distress.     Breath sounds: Normal breath sounds. No decreased breath sounds, wheezing, rhonchi or rales.  Chest:     Chest wall: No tenderness.     Breasts: Breasts are symmetrical.        Right: No mass, nipple discharge, skin change or tenderness.        Left: No mass, nipple discharge, skin change or tenderness.  Abdominal:     General: Bowel sounds are normal. There is no distension.     Palpations: Abdomen is soft. There is no mass.     Tenderness: There is no abdominal tenderness. There is no guarding or rebound.  Musculoskeletal: Normal range of motion.        General: No tenderness or deformity.  Lymphadenopathy:     Cervical: No cervical adenopathy.  Skin:    General: Skin is warm and dry.     Findings: No erythema.  Neurological:     Mental Status: She is alert and oriented to person, place, and time.     Cranial Nerves: No cranial nerve deficit.     Coordination: Coordination normal.     Deep Tendon Reflexes: Reflexes are normal and symmetric.  Psychiatric:        Speech: Speech normal.         Behavior: Behavior normal.        Thought Content: Thought content normal.        Judgment: Judgment normal.          Patient has been counseled extensively about nutrition and exercise as well as the importance of adherence with medications and regular follow-up. The patient was given clear instructions to go to ER or return to medical center if symptoms don't improve, worsen or new problems develop. The patient verbalized understanding.   Follow-up: Return in about 3 weeks (around 11/08/2018) for f/u rash behind ears.  Claiborne Rigg, FNP-BC [facility] [place], [place] [phone]   10/24/2018, 9:16 AM

## 2018-10-24 ENCOUNTER — Encounter: Payer: Self-pay | Admitting: Nurse Practitioner

## 2018-11-21 ENCOUNTER — Other Ambulatory Visit: Payer: Self-pay

## 2018-11-21 ENCOUNTER — Encounter: Payer: Self-pay | Admitting: Nurse Practitioner

## 2018-11-21 ENCOUNTER — Ambulatory Visit: Payer: Self-pay | Attending: Nurse Practitioner | Admitting: Nurse Practitioner

## 2018-11-21 VITALS — BP 122/84 | HR 80 | Temp 98.1°F | Resp 12 | Wt 191.0 lb

## 2018-11-21 DIAGNOSIS — Z79899 Other long term (current) drug therapy: Secondary | ICD-10-CM | POA: Insufficient documentation

## 2018-11-21 DIAGNOSIS — I10 Essential (primary) hypertension: Secondary | ICD-10-CM | POA: Insufficient documentation

## 2018-11-21 DIAGNOSIS — Z8249 Family history of ischemic heart disease and other diseases of the circulatory system: Secondary | ICD-10-CM | POA: Insufficient documentation

## 2018-11-21 DIAGNOSIS — R21 Rash and other nonspecific skin eruption: Secondary | ICD-10-CM | POA: Insufficient documentation

## 2018-11-21 DIAGNOSIS — Z0000 Encounter for general adult medical examination without abnormal findings: Secondary | ICD-10-CM

## 2018-11-21 DIAGNOSIS — H7291 Unspecified perforation of tympanic membrane, right ear: Secondary | ICD-10-CM | POA: Insufficient documentation

## 2018-11-21 DIAGNOSIS — Z833 Family history of diabetes mellitus: Secondary | ICD-10-CM | POA: Insufficient documentation

## 2018-11-21 MED ORDER — NEOMYCIN-POLYMYXIN-HC 3.5-10000-1 OT SOLN: 3.0000 [drp] | Freq: Four times a day (QID) | OTIC | 0 refills | Status: AC

## 2018-11-21 MED ORDER — TRIAMCINOLONE ACETONIDE 0.1 % EX CREA: 1.0000 "application " | TOPICAL_CREAM | Freq: Two times a day (BID) | CUTANEOUS | 1 refills | Status: AC

## 2018-11-21 NOTE — Patient Instructions (Signed)
[facility] [phone]
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
## 2018-11-21 NOTE — Progress Notes (Signed)
Follow up- rash Rash has spread to scalp No pain. Itching

## 2018-11-22 ENCOUNTER — Other Ambulatory Visit (HOSPITAL_COMMUNITY): Payer: Self-pay | Admitting: *Deleted

## 2018-11-22 ENCOUNTER — Encounter: Payer: Self-pay | Admitting: Nurse Practitioner

## 2018-11-22 DIAGNOSIS — Z1231 Encounter for screening mammogram for malignant neoplasm of breast: Secondary | ICD-10-CM

## 2018-11-22 LAB — CMP14+EGFR
ALT: 51 IU/L — ABNORMAL HIGH (ref 0–32)
AST: 37 IU/L (ref 0–40)
Albumin/Globulin Ratio: 1.4 (ref 1.2–2.2)
Albumin: 4.5 g/dL (ref 3.5–5.5)
Alkaline Phosphatase: 136 IU/L — ABNORMAL HIGH (ref 39–117)
BUN / CREAT RATIO: 17 (ref 9–23)
BUN: 11 mg/dL (ref 6–24)
Bilirubin Total: 0.2 mg/dL (ref 0.0–1.2)
CHLORIDE: 101 mmol/L (ref 96–106)
CO2: 25 mmol/L (ref 20–29)
CREATININE: 0.66 mg/dL (ref 0.57–1.00)
Calcium: 9.8 mg/dL (ref 8.7–10.2)
GFR calc Af Amer: 122 mL/min/{1.73_m2} (ref >59)
GFR calc non Af Amer: 106 mL/min/{1.73_m2} (ref >59)
GLUCOSE: 118 mg/dL — AB (ref 65–99)
Globulin, Total: 3.3 g/dL (ref 1.5–4.5)
Potassium: 4.6 mmol/L (ref 3.5–5.2)
Sodium: 141 mmol/L (ref 134–144)
Total Protein: 7.8 g/dL (ref 6.0–8.5)

## 2018-11-22 LAB — LIPID PANEL
CHOL/HDL RATIO: 5.6 ratio — AB (ref 0.0–4.4)
Cholesterol, Total: 203 mg/dL — ABNORMAL HIGH (ref 100–199)
HDL: 36 mg/dL — ABNORMAL LOW (ref >39)
LDL CALC: 129 mg/dL — AB (ref 0–99)
TRIGLYCERIDES: 190 mg/dL — AB (ref 0–149)
VLDL CHOLESTEROL CAL: 38 mg/dL (ref 5–40)

## 2018-11-22 LAB — CBC
HEMATOCRIT: 41.7 % (ref 34.0–46.6)
Hemoglobin: 14.1 g/dL (ref 11.1–15.9)
MCH: 28.8 pg (ref 26.6–33.0)
MCHC: 33.8 g/dL (ref 31.5–35.7)
MCV: 85 fL (ref 79–97)
Platelets: 258 10*3/uL (ref 150–450)
RBC: 4.9 x10E6/uL (ref 3.77–5.28)
RDW: 12.1 % (ref 11.7–15.4)
WBC: 6.8 10*3/uL (ref 3.4–10.8)

## 2018-11-29 ENCOUNTER — Emergency Department (HOSPITAL_COMMUNITY)
Admission: EM | Admit: 2018-11-29 | Discharge: 2018-11-29 | Disposition: A | Discharge status: Home / Self Care | Payer: Self-pay | Attending: Emergency Medicine | Admitting: Emergency Medicine

## 2018-11-29 ENCOUNTER — Encounter (HOSPITAL_COMMUNITY): Payer: Self-pay | Admitting: Emergency Medicine

## 2018-11-29 ENCOUNTER — Other Ambulatory Visit: Payer: Self-pay

## 2018-11-29 DIAGNOSIS — Y92009 Unspecified place in unspecified non-institutional (private) residence as the place of occurrence of the external cause: Secondary | ICD-10-CM | POA: Insufficient documentation

## 2018-11-29 DIAGNOSIS — T2015XA Burn of first degree of scalp [any part], initial encounter: Secondary | ICD-10-CM | POA: Insufficient documentation

## 2018-11-29 DIAGNOSIS — T2220XA Burn of second degree of shoulder and upper limb, except wrist and hand, unspecified site, initial encounter: Secondary | ICD-10-CM

## 2018-11-29 DIAGNOSIS — X102XXA Contact with fats and cooking oils, initial encounter: Secondary | ICD-10-CM | POA: Insufficient documentation

## 2018-11-29 DIAGNOSIS — Y998 Other external cause status: Secondary | ICD-10-CM | POA: Insufficient documentation

## 2018-11-29 DIAGNOSIS — Y93G3 Activity, cooking and baking: Secondary | ICD-10-CM | POA: Insufficient documentation

## 2018-11-29 DIAGNOSIS — T22111A Burn of first degree of right forearm, initial encounter: Secondary | ICD-10-CM | POA: Insufficient documentation

## 2018-11-29 DIAGNOSIS — Z79899 Other long term (current) drug therapy: Secondary | ICD-10-CM | POA: Insufficient documentation

## 2018-11-29 DIAGNOSIS — Z23 Encounter for immunization: Secondary | ICD-10-CM | POA: Insufficient documentation

## 2018-11-29 DIAGNOSIS — I10 Essential (primary) hypertension: Secondary | ICD-10-CM | POA: Insufficient documentation

## 2018-11-29 MED ORDER — SILVER SULFADIAZINE 1 % EX CREA
TOPICAL_CREAM | Freq: Once | CUTANEOUS | Status: AC
  Administered 2018-11-29: 22:00:00 via TOPICAL
  Filled 2018-11-29: qty 85

## 2018-11-29 MED ORDER — OXYCODONE-ACETAMINOPHEN 5-325 MG PO TABS: 1.0000 | ORAL_TABLET | ORAL | 0 refills | Status: AC | PRN

## 2018-11-29 MED ORDER — TETANUS-DIPHTH-ACELL PERTUSSIS 5-2.5-18.5 LF-MCG/0.5 IM SUSP
0.5000 mL | Freq: Once | INTRAMUSCULAR | Status: AC
  Administered 2018-11-29: 0.5 mL via INTRAMUSCULAR
  Filled 2018-11-29: qty 0.5

## 2018-11-29 MED ORDER — CEPHALEXIN 500 MG PO CAPS: 500.0000 mg | ORAL_CAPSULE | Freq: Two times a day (BID) | ORAL | 0 refills | Status: AC

## 2018-11-29 MED ORDER — OXYCODONE-ACETAMINOPHEN 5-325 MG PO TABS
1.0000 | ORAL_TABLET | Freq: Once | ORAL | Status: AC
  Administered 2018-11-29: 1 via ORAL
  Filled 2018-11-29: qty 1
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
## 2018-12-20 ENCOUNTER — Ambulatory Visit: Payer: Self-pay | Admitting: Critical Care Medicine

## 2019-01-04 ENCOUNTER — Ambulatory Visit (INDEPENDENT_AMBULATORY_CARE_PROVIDER_SITE_OTHER): Payer: Self-pay | Admitting: Otolaryngology

## 2019-01-04 DIAGNOSIS — H608X3 Other otitis externa, bilateral: Secondary | ICD-10-CM

## 2019-01-30 ENCOUNTER — Other Ambulatory Visit: Payer: Self-pay

## 2019-01-30 ENCOUNTER — Ambulatory Visit: Payer: Self-pay | Attending: Nurse Practitioner | Admitting: Nurse Practitioner

## 2019-01-30 ENCOUNTER — Encounter: Payer: Self-pay | Admitting: Nurse Practitioner

## 2019-01-30 VITALS — BP 130/83 | HR 84 | Temp 98.9°F | Ht 61.0 in | Wt 187.0 lb

## 2019-01-30 DIAGNOSIS — I10 Essential (primary) hypertension: Secondary | ICD-10-CM

## 2019-01-30 DIAGNOSIS — R21 Rash and other nonspecific skin eruption: Secondary | ICD-10-CM

## 2019-01-30 DIAGNOSIS — R221 Localized swelling, mass and lump, neck: Secondary | ICD-10-CM

## 2019-01-30 MED ORDER — AMLODIPINE BESYLATE 5 MG PO TABS: 5.0000 mg | ORAL_TABLET | Freq: Every day | ORAL | 0 refills | Status: DC

## 2019-01-30 MED ORDER — SULFAMETHOXAZOLE-TRIMETHOPRIM 800-160 MG PO TABS: 1.0000 | ORAL_TABLET | Freq: Two times a day (BID) | ORAL | 0 refills | Status: DC

## 2019-01-30 MED ORDER — NYSTATIN 100000 UNIT/GM EX POWD: Freq: Four times a day (QID) | CUTANEOUS | 1 refills | Status: DC

## 2019-01-30 MED ORDER — HYDROXYZINE PAMOATE 25 MG PO CAPS: 25.0000 mg | ORAL_CAPSULE | Freq: Three times a day (TID) | ORAL | 0 refills | Status: DC | PRN

## 2019-01-30 MED ORDER — PREDNISONE 20 MG PO TABS: ORAL_TABLET | ORAL | 0 refills | Status: DC
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
## 2019-02-26 NOTE — Progress Notes (Addendum)
Subjective:    Patient ID: [name], female    DOB: 1971/07/04, 48 y.o.   MRN: [id number]  This is a 48 year old Latina female who is seen in return visit for a skin rash.  The patient was seen end of March by her primary care provider [name] who prescribed prednisone nystatin and hydroxyzine.  The patient took the prednisone and the rash did not really resolved.  The patient was unable to afford the nystatin.  The rash continues to persist and is been present since January.  The patient denies any arthritis.  She is not had pre-existing history of this type of rash before.  It is involving her scalp trunk arms and legs.  It is a raised rash with a scaly appearance.  There is also a separate type of rash under both breasts as well.  Note at the last visit the patient began amlodipine 5 mg daily and the patient's blood pressures been well controlled on this.  She also has a mass in the left side of the neck and has an ultrasound of the head and neck pending   Past Medical History:  Diagnosis Date  . GERD (gastroesophageal reflux disease)   . Hypertension    2011     Family History  Problem Relation Age of Onset  . Diabetes Mother   . Hypertension Mother   . Cancer Neg Hx   . Heart disease Neg Hx   . Kidney disease Neg Hx   . Depression Neg Hx      Social History   Socioeconomic History  . Marital status: Single    Spouse name: Not on file  . Number of children: 3   . Years of education: 58  . Highest education level: Not on file  Occupational History  . Occupation: Futures trader   Social Needs  . Financial resource strain: Not on file  . Food insecurity:    Worry: Not on file    Inability: Not on file  . Transportation needs:    Medical: Not on file    Non-medical: Not on file  Tobacco Use  . Smoking status: Never Smoker  . Smokeless tobacco: Never Used  Substance and Sexual Activity  . Alcohol use: No  . Drug use: No  . Sexual activity: Yes   Birth control/protection: Surgical  Lifestyle  . Physical activity:    Days per week: Not on file    Minutes per session: Not on file  . Stress: Not on file  Relationships  . Social connections:    Talks on phone: Not on file    Gets together: Not on file    Attends religious service: Not on file    Active member of club or organization: Not on file    Attends meetings of clubs or organizations: Not on file    Relationship status: Not on file  . Intimate partner violence:    Fear of current or ex partner: Not on file    Emotionally abused: Not on file    Physically abused: Not on file    Forced sexual activity: Not on file  Other Topics Concern  . Not on file  Social History Narrative   Lives at home with 3 children.   Does have a partner.    From [place] moved to [place] 12 years ago.      No Known Allergies   Outpatient Medications Prior to Visit  Medication Sig Dispense Refill  . amLODipine (NORVASC) 5  MG tablet Take 1 tablet (5 mg total) by mouth daily. 90 tablet 0  . hydrOXYzine (VISTARIL) 25 MG capsule Take 1 capsule (25 mg total) by mouth 3 (three) times daily as needed. 60 capsule 0  . MULTIPLE VITAMINS ESSENTIAL PO Take by mouth.    . nystatin (MYCOSTATIN/NYSTOP) powder Apply topically 4 (four) times daily. 60 g 1  . predniSONE (DELTASONE) 20 MG tablet Take 2 Tablets by mouth daily with food for 5 days.  Take 1 tablet by mouth daily for 5 days. Then take 1/2 tablet by mouth for 2 days. 17 tablet 0   No facility-administered medications prior to visit.       Review of Systems  Constitutional: Negative.   HENT: Negative.        Mass on left neck  Eyes: Negative.   Respiratory: Negative.   Cardiovascular: Negative.   Gastrointestinal: Negative.   Genitourinary: Negative.   Musculoskeletal: Negative.   Skin: Positive for rash.  Neurological: Negative.   Hematological: Negative.   Psychiatric/Behavioral: Negative.        Objective:   Physical Exam  Vitals:   02/27/19 1035  BP: 124/79  Pulse: (!) 108  Resp: 17  Temp: 98.1 F (36.7 C)  TempSrc: Oral  SpO2: 97%  Weight: 190 lb (86.2 kg)    Gen: Pleasant, obese , in no distress,  normal affect  ENT: No lesions,  mouth clear,  oropharynx clear, no postnasal drip  Neck: No JVD, no TMG, no carotid bruits  Area on left neck again observed  Lungs: No use of accessory muscles, no dullness to percussion, clear without rales or rhonchi  Cardiovascular: RRR, heart sounds normal, no murmur or gallops, no peripheral edema  Abdomen: soft and NT, no HSM,  BS normal  Musculoskeletal: No deformities, no cyanosis or clubbing  Neuro: alert, non focal  Skin: see photo of severe psoriatic rash over all body c/w psoriasis vularis.  Also on scalp.   Media Information   Document Information   Photos  Anterior abdomen  02/27/2019 10:50  Attached To:  Office Visit on 02/27/19 with Cheryl Frisk, MD  Source Information   Cheryl Frisk, MD  [facility]          Assessment & Plan:  I personally reviewed all images and lab data in the [facility] system as well as any outside material available during this office visit and agree with the  radiology impressions.   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  We will issue another round of prednisone and also administer topical steroids mixed with moisturizer for all of the lesions in the skin with the exception of the lesions under the breasts for which she will use clotrimazole cream twice daily   [name] was seen today for rash.  Diagnoses and all orders for this visit:  Psoriasis  vulgaris  Skin rash -     predniSONE (DELTASONE) 20 MG tablet; Take 2 Tablets by mouth daily with food for 5 days.  Take 1 tablet by mouth daily for 5 days. Then take 1/2 tablet by mouth for 2 days.  Essential hypertension  Other orders -  clotrimazole (LOTRIMIN) 1 % cream; Apply 1 application topically 2 (two) times daily. Apply under the breast areas to affected areas -     triamcinolone cream (KENALOG) 0.1 %; Apply 1 application topically 3 (three) times daily. Mix with skin moisturizer   A return visit with her primary care provider will be made as well which can be a tele visit

## 2019-02-27 ENCOUNTER — Ambulatory Visit: Payer: Self-pay | Attending: Critical Care Medicine | Admitting: Critical Care Medicine

## 2019-02-27 ENCOUNTER — Other Ambulatory Visit: Payer: Self-pay

## 2019-02-27 ENCOUNTER — Encounter: Payer: Self-pay | Admitting: Critical Care Medicine

## 2019-02-27 ENCOUNTER — Ambulatory Visit (HOSPITAL_COMMUNITY): Payer: Self-pay

## 2019-02-27 VITALS — BP 124/79 | HR 108 | Temp 98.1°F | Resp 17 | Wt 190.0 lb

## 2019-02-27 DIAGNOSIS — I10 Essential (primary) hypertension: Secondary | ICD-10-CM

## 2019-02-27 DIAGNOSIS — L400 Psoriasis vulgaris: Secondary | ICD-10-CM

## 2019-02-27 DIAGNOSIS — R21 Rash and other nonspecific skin eruption: Secondary | ICD-10-CM

## 2019-02-27 MED ORDER — CLOTRIMAZOLE 1 % EX CREA: 1.0000 "application " | TOPICAL_CREAM | Freq: Two times a day (BID) | CUTANEOUS | 0 refills | Status: DC

## 2019-02-27 MED ORDER — TRIAMCINOLONE ACETONIDE 0.1 % EX CREA: 1.0000 "application " | TOPICAL_CREAM | Freq: Three times a day (TID) | CUTANEOUS | 2 refills | Status: DC

## 2019-02-27 MED ORDER — PREDNISONE 20 MG PO TABS: ORAL_TABLET | ORAL | 0 refills | Status: DC

## 2019-02-27 MED FILL — TRIAMCINOLONE ACETONIDE 0.1: 0.1 | 14 days supply | Qty: 30 | Fill #0

## 2019-02-27 MED FILL — predniSONE 20 MG TABS: 20 | 12 days supply | Qty: 17 | Fill #0

## 2019-02-27 NOTE — Assessment & Plan Note (Signed)
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  We will issue another round of prednisone and also administer topical steroids mixed with moisturizer for all of the lesions in the skin with the exception of the lesions under the breasts for which she will use clotrimazole cream twice daily

## 2019-02-27 NOTE — Assessment & Plan Note (Signed)
Hypertension well controlled we will continue amlodipine 5 mg daily

## 2019-02-27 NOTE — Progress Notes (Signed)
Worked up patient for her in office visit with Dr. Delford Field. Patient was prescribed Nystatin powder at her last visit but was unable to get it due to cost. Patient states that the rash has continued to spread. States that it is red, itchy & peeling.

## 2019-02-27 NOTE — Patient Instructions (Addendum)
Use clotrimazole cream twice a day under the breasts  Use triamcinolone cream mixed with skin moisturizer 3 times a day to the affected areas across your body  Take prednisone again  Your ultrasound will be scheduled  An appointment with dermatology will be made  See [name] in one month

## 2019-03-02 ENCOUNTER — Ambulatory Visit (HOSPITAL_COMMUNITY): Payer: Self-pay

## 2019-03-23 ENCOUNTER — Other Ambulatory Visit: Payer: Self-pay | Admitting: Nurse Practitioner

## 2019-03-30 ENCOUNTER — Encounter: Payer: Self-pay | Admitting: Nurse Practitioner

## 2019-03-30 ENCOUNTER — Other Ambulatory Visit: Payer: Self-pay

## 2019-03-30 ENCOUNTER — Ambulatory Visit: Payer: Self-pay | Attending: Nurse Practitioner | Admitting: Nurse Practitioner

## 2019-03-30 DIAGNOSIS — L400 Psoriasis vulgaris: Secondary | ICD-10-CM

## 2019-03-30 MED ORDER — MOMETASONE FUROATE 0.1 % EX CREA: 1.0000 "application " | TOPICAL_CREAM | Freq: Every day | CUTANEOUS | 1 refills | Status: DC

## 2019-03-30 NOTE — Progress Notes (Signed)
Virtual Visit via Telephone Note Due to national recommendations of social distancing due to COVID 19, telehealth visit is felt to be most appropriate for this patient at this time.  I discussed the limitations, risks, security and privacy concerns of performing an evaluation and management service by telephone and the availability of in person appointments. I also discussed with the patient that there may be a patient responsible charge related to this service. The patient expressed understanding and agreed to proceed.    I connected with [name] on 03/30/19  at   2:10 PM EDT  EDT by telephone and verified that I am speaking with the correct person using two identifiers.   Consent I discussed the limitations, risks, security and privacy concerns of performing an evaluation and management service by telephone and the availability of in person appointments. I also discussed with the patient that there may be a patient responsible charge related to this service. The patient expressed understanding and agreed to proceed.   Location of Patient: Private  Residence   [facility]    Persons participating in Telemedicine visit: Bertram DenverZelda Kymere Fullington FNP-BC YY [place] CMA [name]  Spanish ID Intepreter # [phone]   History of Present Illness: [facility]  She has been treated for Psoriasis vulgaris on 02-27-2019 with prednisone and topical steroids for psoriasis vulgaris as well as clotrimazole cream BID for fungal rash beneath the bilateral breasts. She was also treated by me on 01-30-2019 with vistaril, nystatin (she did not pick up due to cost) and prednisone 20 mg  X 5 days with no relief of rash.  Today she reports that the rash has not improved. She has been referred to Dermatology twice however they are limited in the amount of patients that they see monthly. She states she  was given mometasone cream by an ENT that worked really well for her and would like to have that prescribed as the other ointments and creams she has been prescribed have not helped to relieve her symptoms.   Past Medical History:  Diagnosis Date  . GERD (gastroesophageal reflux disease)   . Hypertension    2011    Past Surgical History:  Procedure Laterality Date  . CESAREAN SECTION     2011  . ECTOPIC PREGNANCY SURGERY  1994   . TUBAL LIGATION     2011    Family History  Problem Relation Age of Onset  . Diabetes Mother   . Hypertension Mother   . Cancer Neg Hx   . Heart disease Neg Hx   . Kidney disease Neg Hx   . Depression Neg Hx     Social History   Socioeconomic History  . Marital status: Single    Spouse name: Not on file  . Number of children: 3   . Years of education: 10512  . Highest education level: Not on file  Occupational History  . Occupation: Futures traderHomemaker   Social Needs  . Financial resource strain: Not on file  . Food insecurity:    Worry: Not on file    Inability: Not on file  . Transportation needs:    Medical: Not on file    Non-medical: Not on file  Tobacco Use  . Smoking status: Never Smoker  . Smokeless tobacco: Never Used  Substance and Sexual Activity  . Alcohol use: No  . Drug use: No  . Sexual activity: Yes  Birth control/protection: Surgical  Lifestyle  . Physical activity:    Days per week: Not on file    Minutes per session: Not on file  . Stress: Not on file  Relationships  . Social connections:    Talks on phone: Not on file    Gets together: Not on file    Attends religious service: Not on file    Active member of club or organization: Not on file    Attends meetings of clubs or organizations: Not on file    Relationship status: Not on file  Other Topics Concern  . Not on file  Social History Narrative   Lives at home with 3 children.   Does have a partner.    From [place] moved to [place] 12 years ago.       Observations/Objective: Awake, alert and oriented x 3   Review of Systems  Constitutional: Negative for fever, malaise/fatigue and weight loss.  HENT: Negative.  Negative for nosebleeds.   Eyes: Negative.  Negative for blurred vision, double vision and photophobia.  Respiratory: Negative.  Negative for cough and shortness of breath.   Cardiovascular: Negative.  Negative for chest pain, palpitations and leg swelling.  Gastrointestinal: Negative.  Negative for heartburn, nausea and vomiting.  Musculoskeletal: Negative.  Negative for myalgias.  Skin: Positive for itching and rash.  Neurological: Negative.  Negative for dizziness, focal weakness, seizures and headaches.  Psychiatric/Behavioral: Negative.  Negative for suicidal ideas.    Assessment and Plan: Narely was evaluated today for rash.  Diagnoses and all orders for this visit:  Psoriasis vulgaris -     mometasone (ELOCON) 0.1 % cream; Apply 1 application topically daily.     Follow Up Instructions Return if symptoms worsen or fail to improve.     I discussed the assessment and treatment plan with the patient. The patient was provided an opportunity to ask questions and all were answered. The patient agreed with the plan and demonstrated an understanding of the instructions.   The patient was advised to call back or seek an in-person evaluation if the symptoms worsen or if the condition fails to improve as anticipated.  I provided 22 minutes of non-face-to-face time during this encounter including median intraservice time, reviewing previous notes, labs, imaging, medications and explaining diagnosis and management.  Claiborne Rigg, FNP-BC

## 2019-04-12 ENCOUNTER — Ambulatory Visit
Admission: RE | Admit: 2019-04-12 | Discharge: 2019-04-12 | Disposition: A | Discharge status: Home / Self Care | Payer: Self-pay | Source: Ambulatory Visit | Attending: Obstetrics and Gynecology | Admitting: Obstetrics and Gynecology

## 2019-04-12 ENCOUNTER — Other Ambulatory Visit: Payer: Self-pay

## 2019-04-12 DIAGNOSIS — Z1231 Encounter for screening mammogram for malignant neoplasm of breast: Secondary | ICD-10-CM

## 2019-04-16 ENCOUNTER — Ambulatory Visit: Payer: Self-pay

## 2019-04-16 ENCOUNTER — Ambulatory Visit (HOSPITAL_COMMUNITY): Payer: Self-pay

## 2019-04-23 ENCOUNTER — Other Ambulatory Visit: Payer: Self-pay

## 2019-04-23 ENCOUNTER — Ambulatory Visit: Payer: Self-pay | Attending: Nurse Practitioner

## 2019-05-15 ENCOUNTER — Other Ambulatory Visit: Payer: Self-pay

## 2019-05-15 ENCOUNTER — Ambulatory Visit (HOSPITAL_COMMUNITY)
Admission: RE | Admit: 2019-05-15 | Discharge: 2019-05-15 | Disposition: A | Discharge status: Home / Self Care | Payer: Self-pay | Source: Ambulatory Visit | Attending: Nurse Practitioner | Admitting: Nurse Practitioner

## 2019-05-15 DIAGNOSIS — R221 Localized swelling, mass and lump, neck: Secondary | ICD-10-CM | POA: Insufficient documentation

## 2019-06-28 ENCOUNTER — Telehealth: Payer: Self-pay | Admitting: Nurse Practitioner

## 2019-06-28 NOTE — Telephone Encounter (Signed)
[name] with [place] dermatology office called to verify information for the patients care and would like to speak to PCP directly. states they need to do systemic psoriasis treatment and would like to follow up prior to doing so. Please follow up.

## 2019-07-04 NOTE — Telephone Encounter (Signed)
Will forward to pcp

## 2019-07-06 ENCOUNTER — Other Ambulatory Visit: Payer: Self-pay | Admitting: Nurse Practitioner

## 2019-07-06 DIAGNOSIS — R21 Rash and other nonspecific skin eruption: Secondary | ICD-10-CM

## 2019-07-08 NOTE — Telephone Encounter (Signed)
We have had a discussion regarding the patient over the telephone. Thanks

## 2019-07-18 NOTE — Progress Notes (Signed)
CMA called patient to schedule a TB skin test.  No answer and LVM.

## 2019-07-24 NOTE — Telephone Encounter (Signed)
-----   Message from [place], [place] sent at 07/09/2019  8:36 AM EDT ----- Please schedule patient a TB skin test on Nurse Visit No Thursday or Friday.

## 2019-07-24 NOTE — Telephone Encounter (Signed)
Pt scheduled  

## 2019-08-07 ENCOUNTER — Telehealth: Payer: Self-pay | Admitting: Nurse Practitioner

## 2019-08-07 ENCOUNTER — Ambulatory Visit: Payer: Self-pay | Attending: Family Medicine

## 2019-08-07 ENCOUNTER — Other Ambulatory Visit: Payer: Self-pay

## 2019-08-07 ENCOUNTER — Other Ambulatory Visit: Payer: Self-pay | Admitting: Nurse Practitioner

## 2019-08-07 DIAGNOSIS — Z111 Encounter for screening for respiratory tuberculosis: Secondary | ICD-10-CM

## 2019-08-07 DIAGNOSIS — K089 Disorder of teeth and supporting structures, unspecified: Secondary | ICD-10-CM

## 2019-08-07 NOTE — Progress Notes (Signed)
Patient arrived at clinic to get PPD test.  Patient received PPD test on her left forearm.   Pt. tolerated well.  Pt. Scheduled an appt.to come back Thursday to get her PPD test read.   Spanish [employer] interpreter was used during the visit.

## 2019-08-07 NOTE — Telephone Encounter (Signed)
Patient came in wanting to know if they can get a referral to a dentist. Please follow up.

## 2019-08-07 NOTE — Telephone Encounter (Signed)
Referral has been placed. 

## 2019-08-07 NOTE — Progress Notes (Signed)
Patient arrived at clinic to get PPD test.  Patient received PPD test on her

## 2019-08-09 ENCOUNTER — Other Ambulatory Visit: Payer: Self-pay

## 2019-08-09 ENCOUNTER — Ambulatory Visit: Payer: Self-pay | Attending: Nurse Practitioner

## 2019-08-09 DIAGNOSIS — Z111 Encounter for screening for respiratory tuberculosis: Secondary | ICD-10-CM

## 2019-08-09 NOTE — Progress Notes (Signed)
Pt. Came in to get her TB skin read.   TB skin read negative.

## 2019-08-30 ENCOUNTER — Other Ambulatory Visit: Payer: Self-pay

## 2019-08-30 ENCOUNTER — Ambulatory Visit: Payer: Self-pay | Attending: Family Medicine

## 2019-08-30 DIAGNOSIS — R21 Rash and other nonspecific skin eruption: Secondary | ICD-10-CM

## 2019-08-31 LAB — CMP14+EGFR
ALT: 30 IU/L (ref 0–32)
AST: 23 IU/L (ref 0–40)
Albumin/Globulin Ratio: 1.3 (ref 1.2–2.2)
Albumin: 4.4 g/dL (ref 3.8–4.8)
Alkaline Phosphatase: 166 IU/L — ABNORMAL HIGH (ref 39–117)
BUN/Creatinine Ratio: 16 (ref 9–23)
BUN: 10 mg/dL (ref 6–24)
Bilirubin Total: 0.2 mg/dL (ref 0.0–1.2)
CO2: 25 mmol/L (ref 20–29)
Calcium: 9.7 mg/dL (ref 8.7–10.2)
Chloride: 104 mmol/L (ref 96–106)
Creatinine, Ser: 0.64 mg/dL (ref 0.57–1.00)
GFR calc Af Amer: 122 mL/min/{1.73_m2} (ref >59)
GFR calc non Af Amer: 106 mL/min/{1.73_m2} (ref >59)
Globulin, Total: 3.3 g/dL (ref 1.5–4.5)
Glucose: 106 mg/dL — ABNORMAL HIGH (ref 65–99)
Potassium: 5.4 mmol/L — ABNORMAL HIGH (ref 3.5–5.2)
Sodium: 142 mmol/L (ref 134–144)
Total Protein: 7.7 g/dL (ref 6.0–8.5)

## 2019-08-31 LAB — CBC WITH DIFFERENTIAL/PLATELET
Basophils Absolute: 0.1 10*3/uL (ref 0.0–0.2)
Basos: 1 %
EOS (ABSOLUTE): 0.3 10*3/uL (ref 0.0–0.4)
Eos: 4 %
Hematocrit: 44.8 % (ref 34.0–46.6)
Hemoglobin: 14.8 g/dL (ref 11.1–15.9)
Immature Grans (Abs): 0 10*3/uL (ref 0.0–0.1)
Immature Granulocytes: 0 %
Lymphocytes Absolute: 3.6 10*3/uL — ABNORMAL HIGH (ref 0.7–3.1)
Lymphs: 48 %
MCH: 28.7 pg (ref 26.6–33.0)
MCHC: 33 g/dL (ref 31.5–35.7)
MCV: 87 fL (ref 79–97)
Monocytes Absolute: 0.6 10*3/uL (ref 0.1–0.9)
Monocytes: 8 %
Neutrophils Absolute: 2.9 10*3/uL (ref 1.4–7.0)
Neutrophils: 39 %
Platelets: 269 10*3/uL (ref 150–450)
RBC: 5.16 x10E6/uL (ref 3.77–5.28)
RDW: 12.5 % (ref 11.7–15.4)
WBC: 7.5 10*3/uL (ref 3.4–10.8)

## 2019-08-31 LAB — HEPATITIS B CORE AB W/REFLEX: Hep B Core Total Ab: NEGATIVE

## 2019-08-31 LAB — HEPATITIS C ANTIBODY: Hep C Virus Ab: <0.1 s/co ratio (ref 0.0–0.9)

## 2019-08-31 LAB — HIV ANTIBODY (ROUTINE TESTING W REFLEX): HIV Screen 4th Generation wRfx: NONREACTIVE

## 2019-09-01 ENCOUNTER — Other Ambulatory Visit: Payer: Self-pay | Admitting: Nurse Practitioner

## 2019-09-01 DIAGNOSIS — R748 Abnormal levels of other serum enzymes: Secondary | ICD-10-CM

## 2019-09-12 ENCOUNTER — Telehealth: Payer: Self-pay | Admitting: Nurse Practitioner

## 2019-09-12 ENCOUNTER — Ambulatory Visit: Payer: Self-pay | Attending: Family Medicine

## 2019-09-12 ENCOUNTER — Other Ambulatory Visit: Payer: Self-pay

## 2019-09-12 DIAGNOSIS — R748 Abnormal levels of other serum enzymes: Secondary | ICD-10-CM

## 2019-09-12 NOTE — Telephone Encounter (Signed)
Patient came in stating she was told to check in, in regards to a dermatology referral once she had her lab work done. Please follow up

## 2019-09-13 LAB — HEPATIC FUNCTION PANEL
ALT: 33 IU/L — ABNORMAL HIGH (ref 0–32)
AST: 27 IU/L (ref 0–40)
Albumin: 4.4 g/dL (ref 3.8–4.8)
Alkaline Phosphatase: 145 IU/L — ABNORMAL HIGH (ref 39–117)
Bilirubin Total: 0.3 mg/dL (ref 0.0–1.2)
Bilirubin, Direct: 0.08 mg/dL (ref 0.00–0.40)
Total Protein: 7.7 g/dL (ref 6.0–8.5)

## 2019-09-13 LAB — GAMMA GT: GGT: 21 IU/L (ref 0–60)

## 2019-09-19 ENCOUNTER — Telehealth: Payer: Self-pay | Admitting: Nurse Practitioner

## 2019-09-19 NOTE — Telephone Encounter (Signed)
Pt call since she saw a specialist for dermatology and now they said she need a new referral to see her again, she want to talk to the referral coordinator to find out what happen, please fu

## 2019-09-25 ENCOUNTER — Other Ambulatory Visit: Payer: Self-pay | Admitting: Nurse Practitioner

## 2019-09-25 DIAGNOSIS — L400 Psoriasis vulgaris: Secondary | ICD-10-CM

## 2019-09-25 NOTE — Telephone Encounter (Signed)
Per referral coordinator.  It has been 6 months and patient is associated with the orange card.  New dermatology referral is needed for patient to continue seeing them with appt.

## 2019-09-25 NOTE — Telephone Encounter (Signed)
Referral placed.

## 2019-09-27 NOTE — Telephone Encounter (Signed)
Noted   I will sent the referral next month  Because it has to be in the first 10 days of the month

## 2019-11-20 ENCOUNTER — Ambulatory Visit: Payer: No Typology Code available for payment source

## 2019-12-03 ENCOUNTER — Other Ambulatory Visit: Payer: Self-pay

## 2019-12-03 ENCOUNTER — Ambulatory Visit: Payer: Self-pay | Attending: Nurse Practitioner | Admitting: Nurse Practitioner

## 2019-12-03 DIAGNOSIS — I10 Essential (primary) hypertension: Secondary | ICD-10-CM

## 2019-12-03 NOTE — Telephone Encounter (Signed)
Pt. Is requesting medication refill for Amlodipine.

## 2019-12-07 NOTE — Telephone Encounter (Signed)
Spoke to patient. Scheduled patient 12/11/2018 to have her medication refills.  Pt. Understood and is aware of the time and date.  Spanish [place] interpreter assist with the call.

## 2019-12-12 ENCOUNTER — Encounter: Payer: Self-pay | Admitting: Nurse Practitioner

## 2019-12-12 ENCOUNTER — Other Ambulatory Visit: Payer: Self-pay

## 2019-12-12 ENCOUNTER — Ambulatory Visit: Payer: Self-pay | Attending: Nurse Practitioner | Admitting: Nurse Practitioner

## 2019-12-12 DIAGNOSIS — L400 Psoriasis vulgaris: Secondary | ICD-10-CM

## 2019-12-12 DIAGNOSIS — M546 Pain in thoracic spine: Secondary | ICD-10-CM

## 2019-12-12 DIAGNOSIS — I10 Essential (primary) hypertension: Secondary | ICD-10-CM

## 2019-12-12 MED ORDER — HYDROXYZINE PAMOATE 25 MG PO CAPS: 25.0000 mg | ORAL_CAPSULE | Freq: Three times a day (TID) | ORAL | 3 refills | Status: DC | PRN

## 2019-12-12 MED ORDER — AMLODIPINE BESYLATE 5 MG PO TABS: 5.0000 mg | ORAL_TABLET | Freq: Every day | ORAL | 0 refills | Status: DC

## 2019-12-12 MED ORDER — CYCLOBENZAPRINE HCL 10 MG PO TABS: 10.0000 mg | ORAL_TABLET | Freq: Three times a day (TID) | ORAL | 1 refills | Status: DC | PRN

## 2019-12-12 MED ORDER — IBUPROFEN 600 MG PO TABS: 600.0000 mg | ORAL_TABLET | Freq: Three times a day (TID) | ORAL | 1 refills | Status: DC | PRN
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
## 2019-12-15 ENCOUNTER — Encounter: Payer: Self-pay | Admitting: Nurse Practitioner

## 2019-12-21 ENCOUNTER — Ambulatory Visit: Payer: Self-pay | Attending: Nurse Practitioner

## 2019-12-21 ENCOUNTER — Other Ambulatory Visit: Payer: Self-pay | Admitting: Nurse Practitioner

## 2019-12-21 ENCOUNTER — Other Ambulatory Visit: Payer: Self-pay

## 2019-12-21 ENCOUNTER — Ambulatory Visit: Payer: No Typology Code available for payment source

## 2019-12-21 DIAGNOSIS — R748 Abnormal levels of other serum enzymes: Secondary | ICD-10-CM

## 2019-12-21 DIAGNOSIS — Z131 Encounter for screening for diabetes mellitus: Secondary | ICD-10-CM

## 2019-12-21 DIAGNOSIS — Z13220 Encounter for screening for lipoid disorders: Secondary | ICD-10-CM

## 2019-12-21 DIAGNOSIS — I10 Essential (primary) hypertension: Secondary | ICD-10-CM

## 2019-12-22 LAB — CMP14+EGFR
ALT: 36 IU/L — ABNORMAL HIGH (ref 0–32)
AST: 29 IU/L (ref 0–40)
Albumin/Globulin Ratio: 1.3 (ref 1.2–2.2)
Albumin: 4.4 g/dL (ref 3.8–4.8)
Alkaline Phosphatase: 153 IU/L — ABNORMAL HIGH (ref 39–117)
BUN/Creatinine Ratio: 16 (ref 9–23)
BUN: 11 mg/dL (ref 6–24)
Bilirubin Total: 0.2 mg/dL (ref 0.0–1.2)
CO2: 24 mmol/L (ref 20–29)
Calcium: 9.8 mg/dL (ref 8.7–10.2)
Chloride: 103 mmol/L (ref 96–106)
Creatinine, Ser: 0.68 mg/dL (ref 0.57–1.00)
GFR calc Af Amer: 120 mL/min/{1.73_m2} (ref >59)
GFR calc non Af Amer: 104 mL/min/{1.73_m2} (ref >59)
Globulin, Total: 3.3 g/dL (ref 1.5–4.5)
Glucose: 112 mg/dL — ABNORMAL HIGH (ref 65–99)
Potassium: 4.9 mmol/L (ref 3.5–5.2)
Sodium: 142 mmol/L (ref 134–144)
Total Protein: 7.7 g/dL (ref 6.0–8.5)

## 2019-12-22 LAB — HEMOGLOBIN A1C
Est. average glucose Bld gHb Est-mCnc: 131 mg/dL
Hgb A1c MFr Bld: 6.2 % — ABNORMAL HIGH (ref 4.8–5.6)

## 2019-12-22 LAB — LIPID PANEL
Chol/HDL Ratio: 5.1 ratio — ABNORMAL HIGH (ref 0.0–4.4)
Cholesterol, Total: 180 mg/dL (ref 100–199)
HDL: 35 mg/dL — ABNORMAL LOW (ref >39)
LDL Chol Calc (NIH): 120 mg/dL — ABNORMAL HIGH (ref 0–99)
Triglycerides: 139 mg/dL (ref 0–149)
VLDL Cholesterol Cal: 25 mg/dL (ref 5–40)

## 2020-04-30 ENCOUNTER — Telehealth: Payer: Self-pay | Admitting: Nurse Practitioner

## 2020-04-30 DIAGNOSIS — I10 Essential (primary) hypertension: Secondary | ICD-10-CM

## 2020-04-30 MED ORDER — AMLODIPINE BESYLATE 5 MG PO TABS: 5.0000 mg | ORAL_TABLET | Freq: Every day | ORAL | 0 refills | Status: DC

## 2020-04-30 NOTE — Telephone Encounter (Signed)
Rx sent for 30 day supply

## 2020-04-30 NOTE — Telephone Encounter (Signed)
1) Medication(s) Requested (by name): amLODipine (NORVASC) 5 MG tablet   2) Pharmacy of Choice:  Patient  Want to use [facility]    3) Special Requests:     Approved medications will be sent to the pharmacy, we will reach out if there is an issue.  Requests made after 3pm may not be addressed until the following business day!  If a patient is unsure of the name of the medication(s) please note and ask patient to call back when they are able to provide all info, do not send to responsible party until all information is available!

## 2020-05-01 MED FILL — AMLODIPINE BESYLATE 5 MG TA: 5 | 30 days supply | Qty: 30 | Fill #0

## 2020-06-04 ENCOUNTER — Other Ambulatory Visit: Payer: Self-pay

## 2020-06-04 ENCOUNTER — Encounter: Payer: Self-pay | Admitting: Nurse Practitioner

## 2020-06-04 ENCOUNTER — Ambulatory Visit: Payer: Self-pay | Attending: Nurse Practitioner | Admitting: Nurse Practitioner

## 2020-06-04 ENCOUNTER — Other Ambulatory Visit: Payer: Self-pay | Admitting: Nurse Practitioner

## 2020-06-04 VITALS — BP 126/81 | HR 89 | Temp 97.7°F | Ht 61.0 in | Wt 192.0 lb

## 2020-06-04 DIAGNOSIS — I10 Essential (primary) hypertension: Secondary | ICD-10-CM

## 2020-06-04 DIAGNOSIS — R7303 Prediabetes: Secondary | ICD-10-CM

## 2020-06-04 DIAGNOSIS — E785 Hyperlipidemia, unspecified: Secondary | ICD-10-CM

## 2020-06-04 MED ORDER — AMLODIPINE BESYLATE 5 MG PO TABS: 5.0000 mg | ORAL_TABLET | Freq: Every day | ORAL | 1 refills | Status: DC

## 2020-06-04 MED FILL — AMLODIPINE BESYLATE 5 MG TA: 5 | 90 days supply | Qty: 90 | Fill #0

## 2020-06-04 NOTE — Patient Instructions (Signed)
Cmo sobrellevar una prdida en los adultos Managing Loss, Adult Las personas vivencian las prdidas de Cashtowndiferentes maneras a lo largo de sus vidas. Acontecimientos como una [place]udanza, un cambio de [place] Citytrabajo y la prdida de amigos puede provocar una sensacin de prdida. La prdida puede ser tan grave como por ejemplo un cambio importante en la salud, un divorcio, la muerte de una mascota o la muerte de un ser querido. Es muy probable que todos estos tipos de prdida causen una reaccin fsica y emocional conocida como duelo. El duelo es el resultado de un cambio grande o la ausencia de algo o alguien que era muy importante para usted. El duelo es una reaccin normal ante la prdida. Diferentes factores pueden afectar su experiencia de duelo, por ejemplo:  La naturaleza de la prdida.  La relacin que tena con la persona que perdi o con lo que perdi.  Su comprensin del duelo y cmo sobrellevarlo.  Su red de contencin. Cmo manejar los cambios en el estilo de vida Mantenga su rutina habitual tanto como sea posible.  Si tiene dificultad para concentrarse o Sonic Automotivehacer las [address]actividades habituales, est bien que se tome un tiempo fuera de Poundesta rutina.  Pase tiempo con amigos y seres queridos.  Siga una dieta saludable, duerma mucho y descanse cuando se sienta cansado. Cmo reconocer los cambios  La manera en que maneje el duelo afectar su capacidad de funcionar como lo hace normalmente. Cuando Duke Energyatraviese el duelo, puede experimentar estos cambios:  Health and safety inspectorAletargamiento, choque, tristeza, ansiedad, enojo, negacin y Jackpotculpa.  Pensamientos sobre la Huntermuerte.  Llanto repentino.  Una sensacin fsica de vaco en el estmago.  Problemas para dormir y Arts administratorcomer.  Cansancio (fatiga).  Prdida de inters en actividades normales.  Soar o Engineer, miningimaginar ver a la persona que muri.  Una necesidad de recordar a la persona que perdi o lo que perdi.  Dificultad para pensar en otra cosa que no sea la prdida durante  un tiempo.  Alivio. Si ha estado esperando la prdida durante un Glenviltiempo, es probable que sienta alivio cuando suceda. Siga estas instrucciones en su casa:  Actividad Exprese sus sentimientos de Delta Air Linesmanera saludable, como por ejemplo:  Hable con otras personas sobre su prdida. Puede ser beneficioso encontrar otras personas que hayan tenido Hamptonuna prdida similar, como un grupo de apoyo.  Anote sus sentimientos en un diario.  Realice actividad fsica para liberar el estrs y la energa emocional.  Maricela CuretHaga actividades creativas como pintura, escultura o tocar un instrumento o Optometristescuchar msica.  Practique la resiliencia. Es la capacidad de reponerse y adaptarse despus de superar un obstculo. La lectura de algunas fuentes que incentiven a la resiliencia puede ser de ayuda para aprender las maneras de Museum/gallery conservatorpracticar esas conductas. Instrucciones generales  Sea paciente con usted mismo y con los dems. Deje que el proceso de duelo transcurra y recuerde que Lockwoodlleva tiempo. ? Algunos tipos de duelo pueden hacer que sienta que no se termina nunca. Puede encontrar Craig Staggersuna manera de seguir adelante sin olvidar ni dejar de tener sentimientos de aprecio por aquello que perdi. ? Aceptar la prdida es un Winthropproceso. Adaptarse puede llevar meses o ms tiempo.  Concurra a todas las visitas de [address]seguimiento como se lo haya indicado el mdico. Esto es importante. Dnde encontrar apoyo Para obtener apoyo para sobrellevar la prdida:  Pida al mdico ayuda y recomendaciones, como asesoramiento o terapia para el duelo.  Piense en la posibilidad de unirse a un grupo de apoyo para personas que estn sobrellevando una prdida.  Dnde buscar ms informacin Puede encontrar ms informacin sobre cmo sobrellevar una prdida en:  Arboriculturist (Sociedad Estadounidense de Oncologa Clnica): www.cancer.net  American Psychological Association (Asociacin Estadounidense de Psicologa): DiceTournament.ca Comunquese con  un mdico si:  El duelo es intenso y [place].  El dolor es constante y no mejora.  Es duelo comienza a Network engineer su organismo, por ejemplo, se enferma.  Se siente deprimido, ansioso o solo. Solicite ayuda inmediatamente si:  Tiene pensamientos acerca de lastimarse a usted mismo o a Economist. Si alguna vez siente que puede lastimarse a usted mismo o a Economist, o tiene pensamientos de poner fin a su vida, busque ayuda de inmediato. Puede dirigirse al servicio de emergencias ms cercano o comunicarse con:  El servicio de emergencias de su localidad (911 en EE.UU.).  Una lnea de asistencia al suicida y Visual merchandiser en crisis, como National Suicide Prevention Lifeline (Lnea Nacional de Prevencin del Suicidio), al (684) 322-5156. Est disponible las 24 horas del da. Resumen  El duelo es el resultado de un cambio grande o la ausencia de algo o alguien que era muy importante para usted. El duelo es una reaccin normal ante la prdida.  Lo profundo del duelo y el perodo de recuperacin dependen del tipo de prdida as como de la capacidad para adaptarse al cambio y Software engineer los sentimientos.  Procesar el duelo requiere ser paciente y estar dispuesto a aceptar sus sentimientos, y Careers adviser la prdida con personas que brindan contencin.  Es Forensic scientist los recursos adecuados para usted y Medical illustrator que cada persona vive el duelo de [place]. No existe un mismo proceso de duelo que suceda de la misma [place] para todas [employer].  Tenga en cuenta que cuando el duelo se torna intenso, puede ocasionar problemas ms graves [place], depresin, ansiedad o pensamientos suicidas. Hable con el mdico si tiene algunos de Limited Brands. Esta informacin no tiene Theme park manager el consejo del mdico. Asegrese de hacerle al mdico cualquier pregunta que tenga. Document Revised: 02/19/2019 Document Reviewed: 05/26/2017 Elsevier Patient Education  2020  Elsevier Inc.  Lenn[employer] complicado Complicated Grief El duelo es [place] normal a la muerte de una persona Special educational needs teacher. Casi todos los que pierden a un ser querido pueden verse afectados por sentimientos de Physicist, medical, enojo y Banker. Tambin es comn que haya sntomas de depresin durante el duelo, entre ellos, problemas para dormir, falta de apetito y falta de [place], que pueden durar semanas o meses despus de la prdida. El duelo complicado es diferente al duelo normal o a la depresin. El duelo normal conlleva tristeza y sentimientos de prdida, pero esas sensaciones mejoran y sanan con Museum/gallery conservator. El duelo complicado es un tipo grave de duelo que dura mucho tiempo, por lo general perdura varios meses, un ao o ms, e interfiere en su capacidad de actuar con normalidad. El duelo complicado puede requerir tratamiento a cargo de un mdico especialista en salud mental. Cules son las causas? Se desconoce la causa de esta afeccin. No est claro por qu algunas personas siguen lidiando con el duelo y [place] no. Qu incrementa el riesgo? Es ms probable que usted sufra esta afeccin si:  La muerte del ser querido fue repentina o inesperada.  La muerte del ser querido se debi a un episodio violento.  La causa de la muerte de su ser querido fue suicidio.  El ser querido era un nio o una persona joven.  Usted tena una conexin [place] fuerte  con su ser querido, o dependa de l o ella.  Tiene antecedentes de depresin o ansiedad. Cules son los signos o los sntomas? Los sntomas de esta afeccin [employer] siguientes:  Sentimientos de incredulidad o falta de emociones (embotamiento [place]).  Incapacidad de disfrutar de los buenos recuerdos del ser querido.  Necesidad de Automotive engineer todo o algunas cosas que le recuerdan al ser querido.  Incapacidad de dejar de pensar acerca de la muerte.  Sentimientos de ira o culpa intensas.  Sentimientos de soledad y desesperanza.  Sentimientos de que  la vida carece de sentido y est vaca.  Prdida del deseo de seguir adelante con su vida. Cmo se diagnostica? Esta afeccin se puede diagnosticar en funcin de lo siguiente:  Sus sntomas. El duelo complicado se diagnostica si tiene sntomas persistentes de duelo durante 6 a 12 meses o ms.  El efecto de los sntomas en su vida. Pueden diagnosticarle esta afeccin si los sntomas interfieren en su capacidad de llevar una vida normal. Es posible que el mdico le recomiende ver a Music therapist en salud mental. Muchos sntomas de depresin son similares a los del duelo complicado. Es importante someterse a evaluaciones para diagnosticar el duelo complicado junto con otras enfermedades [place]. Cmo se trata? Esta afeccin se trata frecuentemente con psicoterapia. Esta terapia la brinde Music therapist en salud mental (psiquiatra). Durante la terapia:  Aprender formas positivas de sobrellevar la prdida de su ser querido.  El Glass blower/designer en salud mental tambin puede indicarle antidepresivos. Siga estas indicaciones en su casa: Estilo de vida   Cudese a s mismo. ? Coma con regularidad y siga una dieta saludable. Coma muchas frutas, verduras, protenas magras y cereales integrales. ? Intente IT sales professional de ejercicio [employer]. Haga 30 minutos de ejercicio la DIRECTV de la Story. ? Mantenga un esquema de descanso sistemtico. Trate de dormir 8horas o ms todas las noches. ? Comience a hacer cosas que sola disfrutar.  No consuma drogas ni alcohol para aliviar los sntomas.  Pase tiempo con amigos y seres queridos. Instrucciones generales  Baxter International de venta libre y los recetados solamente como se lo haya indicado el mdico.  Considere la posibilidad de unirse a un grupo de apoyo para casos de duelo (sentimiento de prdida) como ayuda para sobrellevar la prdida.  Concurra a todas las visitas de control como se lo haya indicado el mdico. Esto es  importante. Comunquese con un mdico si:  Sus sntomas le impiden desempear sus funciones con normalidad.  Los sntomas no mejoran con Scientist, research (medical). Solicite ayuda de inmediato si:  Piensa seriamente en lastimarse a usted mismo o a Engineer, maintenance (IT).  Tiene sentimientos suicidas. Si alguna vez siente que puede lastimarse a usted mismo o a Economist, o tiene pensamientos de poner fin a su vida, busque ayuda de inmediato. Puede dirigirse al servicio de emergencias ms cercano o comunicarse con:  El servicio de emergencias de su localidad (911 en EE.UU.).  Una lnea de asistencia al suicida y Visual merchandiser en crisis, como la [employer] de Prevencin del Suicidio (National Suicide Prevention Lifeline), al [phone]. Est disponible las 24 horas del da. Resumen  El duelo complicado es un tipo grave de duelo que dura [place]. Es probable que este duelo no desaparecer por s solo. Solicite la [employer] necesite.  Algunos duelos son ms difciles que otros y pueden causar esta afeccin. Tal vez necesite un tipo de tratamiento para ayudar a recuperarse si la prdida de  su ser querido fue repentina, violenta o la cause de un suicidio.  Tal vez se sienta culpable por seguir con su vida. Recibir ayuda no significa que usted se est olvidando de su ser querido. Significa que se est cuidado a s mismo.  El duelo complicado se trata mejor con psicoterapia. Adems, es posible que le receten medicamentos.  Solicite la [employer] necesite, y encuentre el apoyo que lo ayudar a recuperarse. Esta informacin no tiene Theme park manager el consejo del mdico. Asegrese de hacerle al mdico cualquier pregunta que tenga. Document Revised: 10/16/2017 Document Reviewed: 10/16/2017 Elsevier Patient Education  2020 Arvin[employer]itor.

## 2020-06-04 NOTE — Progress Notes (Signed)
Assessment & Plan:  [name] was seen today for hypertension.  Diagnoses and all orders for this visit:  Essential hypertension -     amLODipine (NORVASC) 5 MG tablet; Take 1 tablet (5 mg total) by mouth daily. -     CMP14+EGFR Continue all antihypertensives as prescribed.  Remember to bring in your blood pressure log with you for your follow up appointment.  DASH/Mediterranean Diets are healthier choices for HTN.    Prediabetes -     Hemoglobin A1c  Dyslipidemia, goal LDL below 70 -     Lipid panel INSTRUCTIONS: Work on a low fat, heart healthy diet and participate in regular aerobic exercise program by working out at least 150 minutes per week; 5 days a week-30 minutes per day. Avoid red meat/beef/steak,  fried foods. junk foods, sodas, sugary drinks, unhealthy snacking, alcohol and smoking.  Drink at least 80 oz of water per day and monitor your carbohydrate intake daily.      Patient has been counseled on age-appropriate routine health concerns for screening and prevention. These are reviewed and up-to-date. Referrals have been placed accordingly. Immunizations are up-to-date or declined.    Subjective:   Chief Complaint  Patient presents with  . Hypertension    Pt. is here for hypertension and medication refills.    HPI [name] 49 y.o. female presents to office today for follow up to HTN.  VRI was used to communicate directly with patient for the entire encounter including providing detailed patient instructions.   Currently taking PSOR-VAL/SKIN CAP for her psoriasis. Her skin condition has signifantly improved   Her mother passed earlier this year. Still grieving. Very tearful today. Her husband also lost his job as well. Situational depression. I recommend grief counseling or therapy at this time. She declines at this time.  Depression screen [facility] 2/9 06/04/2020 01/30/2019 11/21/2018 10/18/2018 01/18/2018  Decreased Interest 1 0 0 1 0  Down,  Depressed, Hopeless 0 - 0 0 0  PHQ - 2 Score 1 0 0 1 0  Altered sleeping 0 0 0 0 1  Tired, decreased energy '1 1 1 1 '$ 0  Change in appetite '1 1 1 1 '$ 0  Feeling bad or failure about yourself  0 0 1 0 0  Trouble concentrating 0 0 0 0 0  Moving slowly or fidgety/restless 0 0 0 0 0  Suicidal thoughts 0 0 0 0 0  PHQ-9 Score '3 2 3 3 1      '$ Essential Hypertension Blood pressure is well controlled on Amlodipine '5mg'$  daily. Denies chest pain, shortness of breath, palpitations, lightheadedness, dizziness, headaches or BLE edema. She does not monitor her blood pressure at home.  BP Readings from Last 3 Encounters:  06/04/20 126/81  02/27/19 124/79  01/30/19 130/83         Review of Systems  Constitutional: Negative for fever, malaise/fatigue and weight loss.  HENT: Negative.  Negative for nosebleeds.   Eyes: Negative.  Negative for blurred vision, double vision and photophobia.  Respiratory: Negative.  Negative for cough and shortness of breath.   Cardiovascular: Negative.  Negative for chest pain, palpitations and leg swelling.  Gastrointestinal: Negative.  Negative for heartburn, nausea and vomiting.  Musculoskeletal: Negative.  Negative for myalgias.  Neurological: Negative.  Negative for dizziness, focal weakness, seizures and headaches.  Psychiatric/Behavioral: Positive for depression. Negative for suicidal ideas.    Past Medical History:  Diagnosis Date  . GERD (gastroesophageal reflux disease)   . Hypertension  2011    Past Surgical History:  Procedure Laterality Date  . CESAREAN SECTION     2011  . ECTOPIC PREGNANCY SURGERY  1994   . TUBAL LIGATION     2011    Family History  Problem Relation Age of Onset  . Diabetes Mother   . Hypertension Mother   . Cancer Neg Hx   . Heart disease Neg Hx   . Kidney disease Neg Hx   . Depression Neg Hx     Social History Reviewed with no changes to be made today.   Outpatient Medications Prior to Visit  Medication Sig  Dispense Refill  . cyclobenzaprine (FLEXERIL) 10 MG tablet Take 1 tablet (10 mg total) by mouth 3 (three) times daily as needed for muscle spasms. 30 tablet 1  . hydrOXYzine (VISTARIL) 25 MG capsule Take 1 capsule (25 mg total) by mouth 3 (three) times daily as needed. 60 capsule 3  . ibuprofen (ADVIL) 600 MG tablet Take 1 tablet (600 mg total) by mouth every 8 (eight) hours as needed. 60 tablet 1  . MULTIPLE VITAMINS ESSENTIAL PO Take by mouth.    . predniSONE (DELTASONE) 20 MG tablet Take 2 Tablets by mouth daily with food for 5 days.  Take 1 tablet by mouth daily for 5 days. Then take 1/2 tablet by mouth for 2 days. 17 tablet 0  . triamcinolone cream (KENALOG) 0.1 % APPLY ONE APPLICATION TOPICALLY TWO TIMES DAILY FOR 30 DAYS 80 g 0  . amLODipine (NORVASC) 5 MG tablet Take 1 tablet (5 mg total) by mouth daily. 30 tablet 0  . mometasone (ELOCON) 0.1 % cream Apply 1 application topically daily. (Patient not taking: Reported on 06/04/2020) 50 g 1   No facility-administered medications prior to visit.    No Known Allergies     Objective:    BP 126/81 (BP Location: Left Arm, Patient Position: Sitting, Cuff Size: Normal)   Pulse 89   Temp 97.7 F (36.5 C) (Temporal)   Ht '5\' 1"'$  (1.549 m)   Wt 192 lb (87.1 kg)   LMP 10/09/2017   SpO2 98%   BMI 36.28 kg/m  Wt Readings from Last 3 Encounters:  06/04/20 192 lb (87.1 kg)  02/27/19 190 lb (86.2 kg)  01/30/19 187 lb (84.8 kg)    Physical Exam Vitals and nursing note reviewed.  Constitutional:      Appearance: She is well-developed.  HENT:     Head: Normocephalic and atraumatic.  Cardiovascular:     Rate and Rhythm: Normal rate and regular rhythm.     Heart sounds: Normal heart sounds. No murmur heard.  No friction rub. No gallop.   Pulmonary:     Effort: Pulmonary effort is normal. No tachypnea or respiratory distress.     Breath sounds: Normal breath sounds. No decreased breath sounds, wheezing, rhonchi or rales.  Chest:     Chest  wall: No tenderness.  Abdominal:     General: Bowel sounds are normal.     Palpations: Abdomen is soft.  Musculoskeletal:        General: Normal range of motion.     Cervical back: Normal range of motion.  Skin:    General: Skin is warm and dry.  Neurological:     Mental Status: She is alert and oriented to person, place, and time.     Coordination: Coordination normal.  Psychiatric:        Mood and Affect: Mood is depressed. Affect is tearful.  Behavior: Behavior normal. Behavior is cooperative.        Thought Content: Thought content normal.        Judgment: Judgment normal.          Patient has been counseled extensively about nutrition and exercise as well as the importance of adherence with medications and regular follow-up. The patient was given clear instructions to go to ER or return to medical center if symptoms don't improve, worsen or new problems develop. The patient verbalized understanding.   Follow-up: Return in about 3 months (around 09/04/2020) for htn.   Gildardo Pounds, FNP-BC [facility] and Rachel Palmer, [place]   06/04/2020, 12:11 PM

## 2020-06-05 LAB — CMP14+EGFR
ALT: 36 IU/L — ABNORMAL HIGH (ref 0–32)
AST: 28 IU/L (ref 0–40)
Albumin/Globulin Ratio: 1.4 (ref 1.2–2.2)
Albumin: 4.4 g/dL (ref 3.8–4.8)
Alkaline Phosphatase: 135 IU/L — ABNORMAL HIGH (ref 48–121)
BUN/Creatinine Ratio: 18 (ref 9–23)
BUN: 12 mg/dL (ref 6–24)
Bilirubin Total: 0.2 mg/dL (ref 0.0–1.2)
CO2: 26 mmol/L (ref 20–29)
Calcium: 9.2 mg/dL (ref 8.7–10.2)
Chloride: 102 mmol/L (ref 96–106)
Creatinine, Ser: 0.68 mg/dL (ref 0.57–1.00)
GFR calc Af Amer: 119 mL/min/{1.73_m2} (ref >59)
GFR calc non Af Amer: 103 mL/min/{1.73_m2} (ref >59)
Globulin, Total: 3.2 g/dL (ref 1.5–4.5)
Glucose: 160 mg/dL — ABNORMAL HIGH (ref 65–99)
Potassium: 4.1 mmol/L (ref 3.5–5.2)
Sodium: 139 mmol/L (ref 134–144)
Total Protein: 7.6 g/dL (ref 6.0–8.5)

## 2020-06-05 LAB — LIPID PANEL
Chol/HDL Ratio: 5.7 ratio — ABNORMAL HIGH (ref 0.0–4.4)
Cholesterol, Total: 200 mg/dL — ABNORMAL HIGH (ref 100–199)
HDL: 35 mg/dL — ABNORMAL LOW (ref >39)
LDL Chol Calc (NIH): 127 mg/dL — ABNORMAL HIGH (ref 0–99)
Triglycerides: 212 mg/dL — ABNORMAL HIGH (ref 0–149)
VLDL Cholesterol Cal: 38 mg/dL (ref 5–40)

## 2020-06-05 LAB — HEMOGLOBIN A1C
Est. average glucose Bld gHb Est-mCnc: 131 mg/dL
Hgb A1c MFr Bld: 6.2 % — ABNORMAL HIGH (ref 4.8–5.6)

## 2020-08-15 ENCOUNTER — Ambulatory Visit: Payer: No Typology Code available for payment source

## 2020-08-25 ENCOUNTER — Ambulatory Visit: Payer: Self-pay | Attending: Nurse Practitioner

## 2020-08-25 ENCOUNTER — Other Ambulatory Visit: Payer: Self-pay

## 2020-09-15 ENCOUNTER — Ambulatory Visit: Payer: Self-pay | Attending: Nurse Practitioner | Admitting: Nurse Practitioner

## 2020-09-15 ENCOUNTER — Other Ambulatory Visit: Payer: Self-pay

## 2020-09-18 ENCOUNTER — Ambulatory Visit: Payer: Self-pay | Attending: Nurse Practitioner | Admitting: Licensed Clinical Social Worker

## 2020-09-18 ENCOUNTER — Other Ambulatory Visit: Payer: Self-pay

## 2020-09-18 ENCOUNTER — Encounter: Payer: Self-pay | Admitting: Licensed Clinical Social Worker

## 2020-09-18 DIAGNOSIS — F4381 Prolonged grief disorder: Secondary | ICD-10-CM

## 2020-09-18 DIAGNOSIS — F4321 Adjustment disorder with depressed mood: Secondary | ICD-10-CM

## 2020-09-18 DIAGNOSIS — F4329 Adjustment disorder with other symptoms: Secondary | ICD-10-CM

## 2020-09-18 NOTE — Progress Notes (Signed)
Integrated Behavioral Health Initial Visit  MRN: [id number] Name: [name] [facility]  Number of Integrated Behavioral Health Clinician visits:: 1/6 Session Start time: 4:15  Session End time: 5:15 Total time: 60  Type of Service: Integrated Behavioral Health- Individual/Family Interpretor:Yes.   Interpretor Name and Language: [name], Spanish   Warm Hand Off Completed. Per PCP referral     SUBJECTIVE: [name] is a 49 y.o. female accompanied by self, intreperter, and grandchild Patient was referred by Dr. Meredeth Oliver for grief and depression. Patient reports the following symptoms/concerns: constant crying, feelings of worthlessness and self-doubt. Duration of problem: past year ; Severity of problem: severe  OBJECTIVE: Mood: Depressed, Hopeless and Worthless and Affect: Depressed and Tearful Risk of harm to self or others: No plan to harm self or others  LIFE CONTEXT: Family and Social: Family is important to Pt. She has a husband, 3 kids, and grandchildren. She feels they don't understand how she feels. School/Work: She stays at home and babysit's the grandchildren Self-Care: Walks daily and studies English Life Changes: Recent death of her mother and her brother.  GOALS ADDRESSED: Patient will: 1. Reduce symptoms of: anxiety, depression and stress by processing her feelings in therapy. 2. Increase knowledge and/or ability of: coping skills, healthy habits and stress reduction by increasing time to herself to process grief. 3. Demonstrate ability to: Increase healthy adjustment to current life circumstances, Increase adequate support systems for patient/family and Begin healthy grieving over loss by speaking with her loved ones about hoe she feels.  INTERVENTIONS: Interventions utilized: Motivational Interviewing, Solution-Focused Strategies, Brief CBT and Supportive Counseling  Standardized Assessments completed: not  completed.  ASSESSMENT: Patient currently experiencing intense grief over the loss of her brother from cancer and her mother from COVID. Pt worries for her father who now lives alone in [place], and struggles to have alone time to process the grief. Her husband wants to be intimate and she is not ready, which leads to upset in their relationship.   Patient may benefit from beginning therapy services, medication (patient declined interest), and increasing self-care by attending to her needs and increasing alone time .  PLAN: 1. Follow up with behavioral health clinician on : the following week 2. Behavioral recommendations: Create at least 10 minutes of alone time per day for self. 3. Referral(s): Integrated Art gallery manager (In Clinic) and [employer] [facility] (LME/Outside Clinic) at [facility]  [facility] Intern

## 2020-09-24 ENCOUNTER — Ambulatory Visit: Payer: No Typology Code available for payment source | Admitting: Licensed Clinical Social Worker

## 2020-10-13 ENCOUNTER — Other Ambulatory Visit: Payer: Self-pay

## 2020-10-13 ENCOUNTER — Ambulatory Visit: Payer: Self-pay | Attending: Nurse Practitioner | Admitting: Licensed Clinical Social Worker

## 2020-10-13 DIAGNOSIS — F4321 Adjustment disorder with depressed mood: Secondary | ICD-10-CM

## 2020-10-13 MED FILL — AMLODIPINE BESYLATE 5 MG TA: 5 | 90 days supply | Qty: 90 | Fill #1

## 2020-10-13 NOTE — Progress Notes (Signed)
Integrated Behavioral Health Follow Up In-Person Visit  MRN: [id number] Name: [name] [facility]  Number of Integrated Behavioral Health Clinician visits: 2/6 Session Start time: 9:10am  Session End time: 10:10 Total time: 60 minutes  Types of Service: Individual psychotherapy  Interpretor:Yes.   Interpretor Name and Language: [name], Spanish  Subjective: [name] is a 49 y.o. female accompanied by self. Patient was referred by Dr. Meredeth Ide for anxiety and depression. Patient reports the following symptoms/concerns: feelings of grief, loss,  Duration of problem: ongoing; Severity of problem: moderate  Objective: Mood: Anxious and Depressed and Affect: Appropriate and Tearful Risk of harm to self or others: No plan to harm self or others  Life Context: Family and Social: Pt expresses great love and concern for her children, grandchildren, and other family members School/Work: Pt studies English in her free time Self-Care: Pt reports that she was taking more time for herself and setting a schedule Life Changes: Pt's family member is currently in the hospital  Patient and/or Family's Strengths/Protective Factors: Social connections, Social and Emotional competence, Concrete supports in place (healthy food, safe environments, etc.) and Sense of purpose  Goals Addressed: Patient will: 1.  Reduce symptoms of: depression and stress by allowing time to process your emotions and grieve in whatever way feels helpful to you  2. Demonstrate ability to: Begin healthy grieving over loss by opening up to family members and sharing emotional needs with others  Progress towards Goals: Ongoing (patient reports feeling much better and will plan to meet one more time to conclude and check final progress)  Interventions: Interventions utilized:  Motivational Interviewing, CBT Cognitive Behavioral Therapy and Supportive Counseling Standardized Assessments  completed: Not Needed  Patient and/or Family Response: positive response to treatment  Patient Centered Plan: Patient is on the following Treatment Plan(s): anxiety and depression. Assessment: Patient currently experiencing feelings of self doubt and grief surrounding her family life after recent loss of loved ones.   Patient may benefit from continuing to take time for herself to grief, knowing that she is enough, and attending next appointment with MSW Intern.  Plan: 1. Follow up with behavioral health clinician on : January 26th 2. Behavioral recommendations: Continue to practice self-care and give grace 3. Referral(s): Integrated Art gallery manager (In Clinic)  Aldine Contes MSW Intern

## 2020-12-03 ENCOUNTER — Other Ambulatory Visit: Payer: Self-pay

## 2020-12-03 ENCOUNTER — Ambulatory Visit: Payer: Self-pay | Attending: Nurse Practitioner | Admitting: Licensed Clinical Social Worker

## 2020-12-03 DIAGNOSIS — F4381 Prolonged grief disorder: Secondary | ICD-10-CM

## 2020-12-03 DIAGNOSIS — F4329 Adjustment disorder with other symptoms: Secondary | ICD-10-CM

## 2020-12-04 NOTE — BH Specialist Note (Signed)
Integrated Behavioral Health Follow Up In-Person Visit  MRN: [id number] Name: [name]  Number of Integrated Behavioral Health Clinician visits: 3/6 Session Start time: 4:00  Session End time: 5:00 Total time: 60 minutes  Types of Service: Individual psychotherapy  Interpretor:Yes.   Interpretor Name and Language: Shariece Viveiros, Spanish  Subjective: [name] is a 50 y.o. female accompanied by self. Patient was referred by Dr. Meredeth Ide for grief therapy. Patient reports the following symptoms/concerns: getting over grief of losing many loved ones over the past year. Duration of problem: ongoing; Severity of problem: moderate  Objective: Mood: Depressed and Affect: Appropriate and Tearful Risk of harm to self or others: No plan to harm self or others  Life Context: Family and Social: Pt is very close with family members School/Work: pt does not work but takes care of her children and grandchildren daily. Self-Care: Pt reports praying, practicing deep breathing, and walking outside to feel better. Life Changes: Pt is still recovering from the loss of several family members over the past year, and has another family member in the [place] now.  Patient and/or Family's Strengths/Protective Factors: Social connections, Concrete supports in place (healthy food, safe environments, etc.), Sense of purpose and Physical Health (exercise, healthy diet, medication compliance, etc.)  Goals Addressed: Patient will: 1.  Reduce symptoms of: anxiety, depression and stress  2.  Increase knowledge and/or ability of: coping skills, self-management skills and stress reduction  3.  Demonstrate ability to: Increase healthy adjustment to current life circumstances and Begin healthy grieving over loss  Progress towards Goals: Ongoing  Interventions: Interventions utilized:  Motivational Interviewing and Supportive Counseling Standardized Assessments  completed: GAD-7 and PHQ 9  Patient and/or Family Response: Pt felt much better from our previous sessions, and wanted to meet one more time in two to three months.  Patient Centered Plan: Patient is on the following Treatment Plan(s): practice balancing time for self-care and family time, as well as learning to open up and express feelings with family members.  Assessment: Patient currently experiencing crying spells from remembering the death of her loved ones. Pt reports great love for her family, but feeling that sometimes she is unappreciated by her family in [place].  Patient may benefit from focusing on her feelings and thoughts and expressing them in healthy ways rather than holding them inside or not letting anyone help.  Plan: 1. Follow up with behavioral health clinician on : two months time. 2. Behavioral recommendations: balance self-care, emotional regulation, and family time. 3. Referral(s): Integrated Art gallery manager (In Clinic)  Aldine Contes MSW Intern

## 2021-01-07 ENCOUNTER — Telehealth: Payer: Self-pay | Admitting: Licensed Clinical Social Worker

## 2021-01-07 NOTE — Telephone Encounter (Signed)
MSW Intern called pt to reschedule appt to earlier in the month due to leaving the placement before the pt's upcoming appt.[employer] IntreperterDon Broach [id number] was used, and the appt was rescheduled from March 30th to March 9th at 2pm.

## 2021-01-14 ENCOUNTER — Other Ambulatory Visit: Payer: Self-pay

## 2021-01-14 ENCOUNTER — Ambulatory Visit: Payer: Self-pay | Attending: Nurse Practitioner | Admitting: Licensed Clinical Social Worker

## 2021-01-14 DIAGNOSIS — F4329 Adjustment disorder with other symptoms: Secondary | ICD-10-CM

## 2021-01-14 DIAGNOSIS — F4381 Prolonged grief disorder: Secondary | ICD-10-CM

## 2021-01-14 DIAGNOSIS — Z7689 Persons encountering health services in other specified circumstances: Secondary | ICD-10-CM

## 2021-01-14 NOTE — BH Specialist Note (Addendum)
Integrated Behavioral Health Follow Up In-Person Visit  MRN: [id number] Name: [name] [facility]  Number of Integrated Behavioral Health Clinician visits: 4/6 Session Start time: 2:00  Session End time: 2:45 Total time: 45  minutes  Types of Service: Individual psychotherapy  Interpretor:Yes.   Interpretor Name and Language: Domingo Cocking   Subjective: [name] is a 50 y.o. female accompanied by self. Patient was referred by Dr. Meredeth Ide for depression, grief, and anxiety symptoms.. Patient reports the following symptoms/concerns: pt reports feeling better from past grief, and now just worries about her son. Duration of problem: ongoing; Severity of problem: mild  Objective: Mood: positive. and Affect: Appropriate Risk of harm to self or others: No plan to harm self or others  Life Context: Family and Social: pt reports feeling closer with her children, husband, and extended family. School/Work: pt tends to her children daily. Self-Care: pt reports taking time to walk outside by herself. Life Changes: pt reports a family member in poor condition in the hospital.  Patient Protective Factors: Social and Emotional competence, Concrete supports in place (healthy food, safe environments, etc.), Sense of purpose and Parental Resilience  Goals Addressed: Pt reports meeting previous goals from prior sessions and feeling better than before. Progress towards Goals: Achieved  Interventions: Interventions utilized:  Motivational Interviewing and CBT Cognitive Behavioral Therapy Standardized Assessments completed: GAD-7 and PHQ 9  Patient Response: pt was hopeful for the future and feeling much better.  Patient Centered Plan: Patient is on the following Treatment Plan(s): call [facility] if in need of therapy in the future. Assessment: Patient currently experiencing concerns for her son, who she wants to learn the value of things and not become a father  at a young age. MSW Intern discussed strategies to communicate her concerns with her son and commended her dedicated parenting.  Patient may benefit from contacting [facility] if feeling down again in the future to establish regular care.  Plan: 1. Follow up with behavioral health clinician on : as needed 2. Behavioral recommendations: contact [facility] as needed 3. Referral(s): Paramedic (LME/Outside Clinic)  Aldine Contes MSW Intern

## 2021-01-15 NOTE — Progress Notes (Signed)
I, Bridgett Larsson, LCSW, have reviewed all documentation for this visit. The documentation on 01/15/21 for the exam, diagnosis, procedures, and orders are all accurate and complete.

## 2021-02-04 ENCOUNTER — Ambulatory Visit: Payer: No Typology Code available for payment source | Admitting: Licensed Clinical Social Worker

## 2021-02-09 ENCOUNTER — Other Ambulatory Visit: Payer: Self-pay

## 2021-02-09 ENCOUNTER — Other Ambulatory Visit: Payer: Self-pay | Admitting: Nurse Practitioner

## 2021-02-09 DIAGNOSIS — I10 Essential (primary) hypertension: Secondary | ICD-10-CM

## 2021-02-09 MED ORDER — AMLODIPINE BESYLATE 5 MG PO TABS
ORAL_TABLET | Freq: Every day | ORAL | 0 refills | Status: DC
  Filled 2021-02-09: qty 30, 30d supply, fill #0

## 2021-02-09 NOTE — Telephone Encounter (Signed)
Courtesy refill. Patient needs to make an appointment. Requested Prescriptions  Pending Prescriptions Disp Refills  . amLODipine (NORVASC) 5 MG tablet 30 tablet 0    Sig: TAKE 1 TABLET (5 MG TOTAL) BY MOUTH DAILY.     Cardiovascular:  Calcium Channel Blockers Passed - 02/09/2021  2:40 PM      Passed - Last BP in normal range    BP Readings from Last 1 Encounters:  06/04/20 126/81         Passed - Valid encounter within last 6 months    Recent Outpatient Visits          3 months ago Grief   [place] [place], [place] D, LCSW   4 months ago Grief reaction with prolonged bereavement   [facility] [place], [place] D, LCSW   8 months ago Essential hypertension   [place] [facility] [place], Shea Stakes, NP   1 year ago Acute midline thoracic back pain   [facility] Claiborne Rigg, NP   1 year ago Psoriasis vulgaris   [facility] [place], Shea Stakes, NP

## 2021-02-10 ENCOUNTER — Other Ambulatory Visit: Payer: Self-pay

## 2021-04-20 ENCOUNTER — Other Ambulatory Visit: Payer: Self-pay

## 2021-04-20 ENCOUNTER — Other Ambulatory Visit: Payer: Self-pay | Admitting: Nurse Practitioner

## 2021-04-20 ENCOUNTER — Ambulatory Visit: Payer: Self-pay | Attending: Nurse Practitioner

## 2021-04-20 DIAGNOSIS — I10 Essential (primary) hypertension: Secondary | ICD-10-CM

## 2021-04-20 MED ORDER — AMLODIPINE BESYLATE 5 MG PO TABS
ORAL_TABLET | Freq: Every day | ORAL | 0 refills | Status: DC
  Filled 2021-04-20 – 2021-05-06 (×2): qty 30, 30d supply, fill #0

## 2021-04-20 NOTE — Telephone Encounter (Signed)
   Notes to clinic: Patient has appt on 06/03/2021 Review for refill Medication last filled on 02/18/2021 for 30 day   Requested Prescriptions  Pending Prescriptions Disp Refills   amLODipine (NORVASC) 5 MG tablet 30 tablet 0    Sig: TAKE 1 TABLET (5 MG TOTAL) BY MOUTH DAILY.      Cardiovascular:  Calcium Channel Blockers Failed - 04/20/2021  9:29 AM      Failed - Valid encounter within last 6 months    Recent Outpatient Visits           6 months ago Grief   [facility] [place], [place] D, LCSW   7 months ago Grief reaction with prolonged bereavement   [place] [address] [facility] [place], [place] D, LCSW   10 months ago Essential hypertension   [place] [facility] [place], Shea Stakes, NP   1 year ago Acute midline thoracic back pain   [facility] [place], Shea Stakes, NP   2 years ago Psoriasis vulgaris   [place] [facility] Claiborne Rigg, NP       Future Appointments             In 1 month Claiborne Rigg, NP [facility]             Passed - Last BP in normal range    BP Readings from Last 1 Encounters:  06/04/20 126/81

## 2021-04-27 ENCOUNTER — Other Ambulatory Visit: Payer: Self-pay

## 2021-05-05 ENCOUNTER — Ambulatory Visit: Payer: Self-pay | Attending: Nurse Practitioner

## 2021-05-05 ENCOUNTER — Other Ambulatory Visit: Payer: Self-pay

## 2021-05-06 ENCOUNTER — Other Ambulatory Visit: Payer: Self-pay

## 2021-05-19 ENCOUNTER — Telehealth: Payer: Self-pay | Admitting: Nurse Practitioner

## 2021-05-19 NOTE — Telephone Encounter (Signed)
Pt was sent a letter from financial dept. Inform them, that the application they submitted was incomplete, since they were missing some documentation at the time of the appointment, Pt need to reschedule and resubmit all new papers and application for [employer] and [place], P.S. old documents has been sent back by mail to the Pt and Pt. need to make a new appt. 

## 2021-06-03 ENCOUNTER — Other Ambulatory Visit: Payer: Self-pay

## 2021-06-03 ENCOUNTER — Ambulatory Visit: Payer: Self-pay | Attending: Nurse Practitioner | Admitting: Physician Assistant

## 2021-06-03 DIAGNOSIS — I10 Essential (primary) hypertension: Secondary | ICD-10-CM

## 2021-06-03 DIAGNOSIS — K649 Unspecified hemorrhoids: Secondary | ICD-10-CM

## 2021-06-03 DIAGNOSIS — E785 Hyperlipidemia, unspecified: Secondary | ICD-10-CM

## 2021-06-03 DIAGNOSIS — Z789 Other specified health status: Secondary | ICD-10-CM

## 2021-06-03 MED ORDER — HYDROCORTISONE ACETATE 25 MG RE SUPP
25.0000 mg | Freq: Two times a day (BID) | RECTAL | 1 refills | Status: DC
  Filled 2021-06-15 – 2021-06-29 (×2): qty 12, 6d supply, fill #0

## 2021-06-03 MED ORDER — AMLODIPINE BESYLATE 5 MG PO TABS
ORAL_TABLET | Freq: Every day | ORAL | 3 refills | Status: DC
Start: 2021-06-03 — End: 2021-10-05
  Filled 2021-06-15 – 2021-06-29 (×2): qty 30, 30d supply, fill #0
  Filled 2021-07-27: qty 30, 30d supply, fill #1
  Filled 2021-10-05: qty 30, 30d supply, fill #2

## 2021-06-03 NOTE — Progress Notes (Signed)
Patient ID: [name], female   DOB: 03/01/1971, 50 y.o.   MRN: [id number] Virtual Visit via Telephone Note  I connected with [name] on 06/03/21 at  2:30 PM EDT by telephone and verified that I am speaking with the correct person using two identifiers.  Location: Patient: home Provider: [facility] office [place] with pacific interpreters   I discussed the limitations, risks, security and privacy concerns of performing an evaluation and management service by telephone and the availability of in person appointments. I also discussed with the patient that there may be a patient responsible charge related to this service. The patient expressed understanding and agreed to proceed.   History of Present Illness:  patient need RF of amlodipine.  Also, intermittently having hemorrhoids and would like suppositories for that.  BP OOO ~120-130/80s. No HA/CP/SOB/dizziness    Observations/Objective:  NAD.  A&Ox3   Assessment and Plan: 1. Essential hypertension Controlled based on OOO readings - amLODipine (NORVASC) 5 MG tablet; TAKE 1 TABLET (5 MG TOTAL) BY MOUTH DAILY.  Dispense: 30 tablet; Refill: 3 - Comprehensive metabolic panel; Future - CBC with Differential/Platelet; Future  2. Hemorrhoids, unspecified hemorrhoid type - hydrocortisone (ANUSOL-HC) 25 MG suppository; Place 1 suppository (25 mg total) rectally 2 (two) times daily.  Dispense: 12 suppository; Refill: 1  3. Hyperlipidemia, unspecified hyperlipidemia type - Comprehensive metabolic panel; Future - Lipid panel; Future  4. Language barrier [employer] interpreters used and additional time performing visit was required.  Follow Up Instructions: See PCP in 4 months   I discussed the assessment and treatment plan with the patient. The patient was provided an opportunity to ask questions and all were answered. The patient agreed with the plan and demonstrated an understanding of the instructions.    The patient was advised to call back or seek an in-person evaluation if the symptoms worsen or if the condition fails to improve as anticipated.  I provided 15 minutes of non-face-to-face time during this encounter.   Georgian Co, PA-C

## 2021-06-10 ENCOUNTER — Ambulatory Visit: Payer: Self-pay | Attending: Nurse Practitioner

## 2021-06-10 ENCOUNTER — Other Ambulatory Visit: Payer: Self-pay

## 2021-06-15 ENCOUNTER — Other Ambulatory Visit: Payer: Self-pay

## 2021-06-22 ENCOUNTER — Other Ambulatory Visit: Payer: Self-pay

## 2021-06-29 ENCOUNTER — Other Ambulatory Visit: Payer: Self-pay

## 2021-07-06 ENCOUNTER — Other Ambulatory Visit: Payer: Self-pay

## 2021-07-06 ENCOUNTER — Ambulatory Visit: Payer: Self-pay | Attending: Nurse Practitioner

## 2021-07-06 DIAGNOSIS — I10 Essential (primary) hypertension: Secondary | ICD-10-CM

## 2021-07-06 DIAGNOSIS — E785 Hyperlipidemia, unspecified: Secondary | ICD-10-CM

## 2021-07-07 LAB — COMPREHENSIVE METABOLIC PANEL
ALT: 30 IU/L (ref 0–32)
AST: 26 IU/L (ref 0–40)
Albumin/Globulin Ratio: 1.3 (ref 1.2–2.2)
Albumin: 4.4 g/dL (ref 3.8–4.8)
Alkaline Phosphatase: 167 IU/L — ABNORMAL HIGH (ref 44–121)
BUN/Creatinine Ratio: 20 (ref 9–23)
BUN: 11 mg/dL (ref 6–24)
Bilirubin Total: <0.2 mg/dL (ref 0.0–1.2)
CO2: 24 mmol/L (ref 20–29)
Calcium: 9.4 mg/dL (ref 8.7–10.2)
Chloride: 101 mmol/L (ref 96–106)
Creatinine, Ser: 0.56 mg/dL — ABNORMAL LOW (ref 0.57–1.00)
Globulin, Total: 3.5 g/dL (ref 1.5–4.5)
Glucose: 117 mg/dL — ABNORMAL HIGH (ref 65–99)
Potassium: 4.3 mmol/L (ref 3.5–5.2)
Sodium: 141 mmol/L (ref 134–144)
Total Protein: 7.9 g/dL (ref 6.0–8.5)
eGFR: 111 mL/min/{1.73_m2} (ref >59)

## 2021-07-07 LAB — CBC WITH DIFFERENTIAL/PLATELET
Basophils Absolute: 0 10*3/uL (ref 0.0–0.2)
Basos: 1 %
EOS (ABSOLUTE): 0.3 10*3/uL (ref 0.0–0.4)
Eos: 4 %
Hematocrit: 43.4 % (ref 34.0–46.6)
Hemoglobin: 14.4 g/dL (ref 11.1–15.9)
Immature Grans (Abs): 0 10*3/uL (ref 0.0–0.1)
Immature Granulocytes: 0 %
Lymphocytes Absolute: 3.6 10*3/uL — ABNORMAL HIGH (ref 0.7–3.1)
Lymphs: 49 %
MCH: 28.8 pg (ref 26.6–33.0)
MCHC: 33.2 g/dL (ref 31.5–35.7)
MCV: 87 fL (ref 79–97)
Monocytes Absolute: 0.6 10*3/uL (ref 0.1–0.9)
Monocytes: 8 %
Neutrophils Absolute: 2.8 10*3/uL (ref 1.4–7.0)
Neutrophils: 38 %
Platelets: 246 10*3/uL (ref 150–450)
RBC: 5 x10E6/uL (ref 3.77–5.28)
RDW: 13 % (ref 11.7–15.4)
WBC: 7.3 10*3/uL (ref 3.4–10.8)

## 2021-07-07 LAB — LIPID PANEL
Chol/HDL Ratio: 6.4 ratio — ABNORMAL HIGH (ref 0.0–4.4)
Cholesterol, Total: 217 mg/dL — ABNORMAL HIGH (ref 100–199)
HDL: 34 mg/dL — ABNORMAL LOW (ref >39)
LDL Chol Calc (NIH): 103 mg/dL — ABNORMAL HIGH (ref 0–99)
Triglycerides: 468 mg/dL — ABNORMAL HIGH (ref 0–149)
VLDL Cholesterol Cal: 80 mg/dL — ABNORMAL HIGH (ref 5–40)

## 2021-07-08 ENCOUNTER — Other Ambulatory Visit: Payer: Self-pay

## 2021-07-08 ENCOUNTER — Other Ambulatory Visit: Payer: Self-pay | Admitting: Physician Assistant

## 2021-07-08 MED ORDER — ATORVASTATIN CALCIUM 20 MG PO TABS
20.0000 mg | ORAL_TABLET | Freq: Every day | ORAL | 3 refills | Status: DC
  Filled 2021-07-08: qty 30, 30d supply, fill #0
  Filled 2021-07-27: qty 90, 90d supply, fill #0

## 2021-07-15 ENCOUNTER — Other Ambulatory Visit: Payer: Self-pay

## 2021-07-27 ENCOUNTER — Other Ambulatory Visit: Payer: Self-pay

## 2021-07-29 ENCOUNTER — Other Ambulatory Visit: Payer: Self-pay

## 2021-10-05 ENCOUNTER — Ambulatory Visit: Payer: Self-pay | Attending: Nurse Practitioner | Admitting: Nurse Practitioner

## 2021-10-05 ENCOUNTER — Other Ambulatory Visit: Payer: Self-pay

## 2021-10-05 ENCOUNTER — Encounter: Payer: Self-pay | Admitting: Nurse Practitioner

## 2021-10-05 VITALS — BP 113/69 | HR 91 | Ht 61.0 in | Wt 182.4 lb

## 2021-10-05 DIAGNOSIS — E785 Hyperlipidemia, unspecified: Secondary | ICD-10-CM

## 2021-10-05 DIAGNOSIS — Z1231 Encounter for screening mammogram for malignant neoplasm of breast: Secondary | ICD-10-CM

## 2021-10-05 DIAGNOSIS — G8929 Other chronic pain: Secondary | ICD-10-CM

## 2021-10-05 DIAGNOSIS — Z23 Encounter for immunization: Secondary | ICD-10-CM

## 2021-10-05 DIAGNOSIS — I10 Essential (primary) hypertension: Secondary | ICD-10-CM

## 2021-10-05 DIAGNOSIS — Z1211 Encounter for screening for malignant neoplasm of colon: Secondary | ICD-10-CM

## 2021-10-05 DIAGNOSIS — M546 Pain in thoracic spine: Secondary | ICD-10-CM

## 2021-10-05 MED ORDER — CYCLOBENZAPRINE HCL 10 MG PO TABS
10.0000 mg | ORAL_TABLET | Freq: Three times a day (TID) | ORAL | 2 refills | Status: DC | PRN
  Filled 2021-10-05: qty 30, 10d supply, fill #0

## 2021-10-05 MED ORDER — AMLODIPINE BESYLATE 5 MG PO TABS
ORAL_TABLET | Freq: Every day | ORAL | 3 refills | Status: DC
  Filled 2021-11-24: qty 30, fill #0
  Filled 2021-11-24: qty 30, 30d supply, fill #0
  Filled 2021-12-28: qty 30, 30d supply, fill #1
  Filled 2022-03-11: qty 30, 30d supply, fill #2
  Filled 2022-06-01: qty 30, 30d supply, fill #3

## 2021-10-05 MED ORDER — ATORVASTATIN CALCIUM 20 MG PO TABS
20.0000 mg | ORAL_TABLET | Freq: Every day | ORAL | 3 refills | Status: DC
  Filled 2021-10-05 – 2021-10-12 (×2): qty 30, 30d supply, fill #0
  Filled 2021-10-26 – 2022-09-29 (×2): qty 90, 90d supply, fill #0

## 2021-10-05 NOTE — Progress Notes (Signed)
Assessment & Plan:  [name] was seen today for hypertension.  Diagnoses and all orders for this visit:  Essential hypertension -     amLODipine (NORVASC) 5 MG tablet; TAKE 1 TABLET (5 MG TOTAL) BY MOUTH DAILY. -     CMP14+EGFR Continue all antihypertensives as prescribed.  Remember to bring in your blood pressure log with you for your follow up appointment.  DASH/Mediterranean Diets are healthier choices for HTN.    Breast cancer screening by mammogram -     MS DIGITAL SCREENING TOMO BILATERAL; Future  Colon cancer screening -     Fecal occult blood, imunochemical([facility])  Acute midline thoracic back pain -     cyclobenzaprine (FLEXERIL) 10 MG tablet; Take 1 tablet (10 mg total) by mouth 3 (three) times daily as needed for muscle spasms.  Dyslipidemia, goal LDL below 100 -     atorvastatin (LIPITOR) 20 MG tablet; Take 1 tablet (20 mg total) by mouth daily.   Patient has been counseled on age-appropriate routine health concerns for screening and prevention. These are reviewed and up-to-date. Referrals have been placed accordingly. Immunizations are up-to-date or declined.    Subjective:   Chief Complaint  Patient presents with   Hypertension    [name] 50 y.o. female presents to office today for follow up to HTN.  She has a past medical history of GERD and Hypertension.  VRI was used to communicate directly with patient for the entire encounter including providing detailed patient instructions.    HTN Blood pressure is well controlled. She is taking amlodipine 5 mg daily as prescribed.  BP Readings from Last 3 Encounters:  10/05/21 113/69  06/04/20 126/81  02/27/19 124/79    Chronic back pain Well controlled with muscle relaxant prn.    Review of Systems  Constitutional:  Negative for fever, malaise/fatigue and weight loss.  HENT: Negative.  Negative for nosebleeds.   Eyes: Negative.  Negative for blurred vision, double vision and  photophobia.  Respiratory: Negative.  Negative for cough and shortness of breath.   Cardiovascular: Negative.  Negative for chest pain, palpitations and leg swelling.  Gastrointestinal: Negative.  Negative for heartburn, nausea and vomiting.  Musculoskeletal:  Positive for back pain. Negative for myalgias.  Neurological: Negative.  Negative for dizziness, focal weakness, seizures and headaches.  Psychiatric/Behavioral: Negative.  Negative for suicidal ideas.    Past Medical History:  Diagnosis Date   GERD (gastroesophageal reflux disease)    Hypertension    2011    Past Surgical History:  Procedure Laterality Date   CESAREAN SECTION     2011   ECTOPIC PREGNANCY SURGERY  1994    TUBAL LIGATION     2011    Family History  Problem Relation Age of Onset   Diabetes Mother    Hypertension Mother    Cancer Neg Hx    Heart disease Neg Hx    Kidney disease Neg Hx    Depression Neg Hx     Social History Reviewed with no changes to be made today.   Outpatient Medications Prior to Visit  Medication Sig Dispense Refill   ibuprofen (ADVIL) 600 MG tablet Take 1 tablet (600 mg total) by mouth every 8 (eight) hours as needed. 60 tablet 1   mometasone (ELOCON) 0.1 % cream Apply 1 application topically daily. 50 g 1   atorvastatin (LIPITOR) 20 MG tablet Take 1 tablet (20 mg total) by mouth daily. 90 tablet 3   cyclobenzaprine (FLEXERIL) 10 MG  tablet Take 1 tablet (10 mg total) by mouth 3 (three) times daily as needed for muscle spasms. 30 tablet 1   hydrOXYzine (VISTARIL) 25 MG capsule Take 1 capsule (25 mg total) by mouth 3 (three) times daily as needed. (Patient not taking: Reported on 10/05/2021) 60 capsule 3   amLODipine (NORVASC) 5 MG tablet TAKE 1 TABLET (5 MG TOTAL) BY MOUTH DAILY. (Patient not taking: Reported on 10/05/2021) 30 tablet 3   hydrocortisone (ANUSOL-HC) 25 MG suppository Place 1 suppository (25 mg total) rectally 2 (two) times daily. (Patient not taking: Reported on  10/05/2021) 12 suppository 1   MULTIPLE VITAMINS ESSENTIAL PO Take by mouth. (Patient not taking: Reported on 10/05/2021)     triamcinolone cream (KENALOG) 0.1 % APPLY ONE APPLICATION TOPICALLY TWO TIMES DAILY FOR 30 DAYS (Patient not taking: Reported on 10/05/2021) 80 g 0   No facility-administered medications prior to visit.    No Known Allergies     Objective:    BP 113/69   Pulse 91   Ht $R'5\' 1"'ed$  (1.549 m)   Wt 182 lb 6 oz (82.7 kg)   LMP 10/09/2017   SpO2 97%   BMI 34.46 kg/m  Wt Readings from Last 3 Encounters:  10/05/21 182 lb 6 oz (82.7 kg)  06/04/20 192 lb (87.1 kg)  02/27/19 190 lb (86.2 kg)    Physical Exam Vitals and nursing note reviewed.  Constitutional:      Appearance: She is well-developed.  HENT:     Head: Normocephalic and atraumatic.  Cardiovascular:     Rate and Rhythm: Normal rate and regular rhythm.     Heart sounds: Normal heart sounds. No murmur heard.   No friction rub. No gallop.  Pulmonary:     Effort: Pulmonary effort is normal. No tachypnea or respiratory distress.     Breath sounds: Normal breath sounds. No decreased breath sounds, wheezing, rhonchi or rales.  Chest:     Chest wall: No tenderness.  Abdominal:     General: Bowel sounds are normal.     Palpations: Abdomen is soft.  Musculoskeletal:        General: Normal range of motion.     Cervical back: Normal range of motion.  Skin:    General: Skin is warm and dry.  Neurological:     Mental Status: She is alert and oriented to person, place, and time.     Coordination: Coordination normal.  Psychiatric:        Behavior: Behavior normal. Behavior is cooperative.        Thought Content: Thought content normal.        Judgment: Judgment normal.         Patient has been counseled extensively about nutrition and exercise as well as the importance of adherence with medications and regular follow-up. The patient was given clear instructions to go to ER or return to medical center if  symptoms don't improve, worsen or new problems develop. The patient verbalized understanding.   Follow-up: Return for PAP SMEAR.   Gildardo Pounds, FNP-BC [facility] and [place], [place]   10/05/2021, 3:39 PM

## 2021-10-12 ENCOUNTER — Other Ambulatory Visit: Payer: Self-pay

## 2021-10-19 ENCOUNTER — Other Ambulatory Visit: Payer: Self-pay

## 2021-10-26 ENCOUNTER — Other Ambulatory Visit: Payer: Self-pay

## 2021-10-26 ENCOUNTER — Ambulatory Visit: Payer: No Typology Code available for payment source | Attending: Nurse Practitioner

## 2021-10-27 LAB — CMP14+EGFR
ALT: 32 IU/L (ref 0–32)
AST: 26 IU/L (ref 0–40)
Albumin/Globulin Ratio: 1.3 (ref 1.2–2.2)
Albumin: 4.5 g/dL (ref 3.8–4.8)
Alkaline Phosphatase: 143 IU/L — ABNORMAL HIGH (ref 44–121)
BUN/Creatinine Ratio: 21 (ref 9–23)
BUN: 16 mg/dL (ref 6–24)
Bilirubin Total: 0.2 mg/dL (ref 0.0–1.2)
CO2: 24 mmol/L (ref 20–29)
Calcium: 9.5 mg/dL (ref 8.7–10.2)
Chloride: 104 mmol/L (ref 96–106)
Creatinine, Ser: 0.77 mg/dL (ref 0.57–1.00)
Globulin, Total: 3.6 g/dL (ref 1.5–4.5)
Glucose: 122 mg/dL — ABNORMAL HIGH (ref 70–99)
Potassium: 5.2 mmol/L (ref 3.5–5.2)
Sodium: 140 mmol/L (ref 134–144)
Total Protein: 8.1 g/dL (ref 6.0–8.5)
eGFR: 94 mL/min/{1.73_m2} (ref >59)

## 2021-11-09 LAB — FECAL OCCULT BLOOD, IMMUNOCHEMICAL: Fecal Occult Bld: NEGATIVE

## 2021-11-11 ENCOUNTER — Other Ambulatory Visit: Payer: Self-pay

## 2021-11-11 DIAGNOSIS — Z1231 Encounter for screening mammogram for malignant neoplasm of breast: Secondary | ICD-10-CM

## 2021-11-12 ENCOUNTER — Telehealth: Payer: Self-pay

## 2021-11-12 NOTE — Telephone Encounter (Signed)
Called patient reviewed all information and repeated back to me. Will call if any questions.  With the help of translator Dilara # [phone]

## 2021-11-12 NOTE — Telephone Encounter (Signed)
-----   Message from Gildardo Pounds, NP sent at 11/10/2021  8:35 PM EST ----- Negative stool test for colon cancer. Kidney function normal. Liver enzymes stable. Try to work on low fat low cholesterol diet and weight loss to help prevent fatty liver

## 2021-11-24 ENCOUNTER — Other Ambulatory Visit: Payer: Self-pay

## 2021-12-07 ENCOUNTER — Encounter: Payer: Self-pay | Admitting: Nurse Practitioner

## 2021-12-07 ENCOUNTER — Other Ambulatory Visit: Payer: Self-pay

## 2021-12-07 ENCOUNTER — Ambulatory Visit: Payer: Self-pay | Attending: Nurse Practitioner | Admitting: Nurse Practitioner

## 2021-12-07 ENCOUNTER — Other Ambulatory Visit (HOSPITAL_COMMUNITY)
Admission: RE | Admit: 2021-12-07 | Discharge: 2021-12-07 | Disposition: A | Discharge status: Home / Self Care | Payer: No Typology Code available for payment source | Source: Ambulatory Visit | Attending: Nurse Practitioner | Admitting: Nurse Practitioner

## 2021-12-07 VITALS — BP 120/71 | HR 77 | Ht 61.0 in | Wt 188.4 lb

## 2021-12-07 DIAGNOSIS — Z124 Encounter for screening for malignant neoplasm of cervix: Secondary | ICD-10-CM | POA: Insufficient documentation

## 2021-12-07 DIAGNOSIS — L400 Psoriasis vulgaris: Secondary | ICD-10-CM

## 2021-12-07 DIAGNOSIS — Z114 Encounter for screening for human immunodeficiency virus [HIV]: Secondary | ICD-10-CM

## 2021-12-07 MED ORDER — MOMETASONE FUROATE 0.1 % EX CREA
1.0000 "application " | TOPICAL_CREAM | Freq: Every day | CUTANEOUS | 1 refills | Status: DC
  Filled 2021-12-07: qty 60, 30d supply, fill #0

## 2021-12-07 MED ORDER — HYDROXYZINE PAMOATE 25 MG PO CAPS
25.0000 mg | ORAL_CAPSULE | Freq: Three times a day (TID) | ORAL | 3 refills | Status: DC | PRN
  Filled 2021-12-07: qty 60, 20d supply, fill #0

## 2021-12-07 NOTE — Progress Notes (Signed)
Assessment & Plan:  [name] was seen today for gynecologic exam.  Diagnoses and all orders for this visit:  Encounter for Papanicolaou smear for cervical cancer screening -     Cytology - PAP -     Cervicovaginal ancillary only  Encounter for screening for HIV -     HIV antibody (with reflex)  Psoriasis vulgaris -     hydrOXYzine (VISTARIL) 25 MG capsule; Take 1 capsule (25 mg total) by mouth 3 (three) times daily as needed. -     mometasone (ELOCON) 0.1 % cream; Apply 1 application topically daily.    Patient has been counseled on age-appropriate routine health concerns for screening and prevention. These are reviewed and up-to-date. Referrals have been placed accordingly. Immunizations are up-to-date or declined.    Subjective:   Chief Complaint  Patient presents with   Gynecologic Exam    [name] 51 y.o. female presents to office today for pap Smear. She is requesting refills of steroid cream and hydroxyzine for pruritic BLE rash.  Review of Systems  Constitutional: Negative.  Negative for chills, fever, malaise/fatigue and weight loss.  Respiratory: Negative.  Negative for cough, shortness of breath and wheezing.   Cardiovascular: Negative.  Negative for chest pain, orthopnea and leg swelling.  Gastrointestinal:  Negative for abdominal pain.  Genitourinary: Negative.  Negative for flank pain.  Skin: Negative.  Negative for rash.  Psychiatric/Behavioral:  Negative for suicidal ideas.    Past Medical History:  Diagnosis Date   GERD (gastroesophageal reflux disease)    Hypertension    2011    Past Surgical History:  Procedure Laterality Date   CESAREAN SECTION     2011   ECTOPIC PREGNANCY SURGERY  1994    TUBAL LIGATION     2011    Family History  Problem Relation Age of Onset   Diabetes Mother    Hypertension Mother    Cancer Neg Hx    Heart disease Neg Hx    Kidney disease Neg Hx    Depression Neg Hx     Social History  Reviewed with no changes to be made today.   Outpatient Medications Prior to Visit  Medication Sig Dispense Refill   amLODipine (NORVASC) 5 MG tablet TAKE 1 TABLET (5 MG TOTAL) BY MOUTH DAILY. 30 tablet 3   atorvastatin (LIPITOR) 20 MG tablet Take 1 tablet (20 mg total) by mouth daily. 90 tablet 3   cyclobenzaprine (FLEXERIL) 10 MG tablet Take 1 tablet (10 mg total) by mouth 3 (three) times daily as needed for muscle spasms. 30 tablet 2   ibuprofen (ADVIL) 600 MG tablet Take 1 tablet (600 mg total) by mouth every 8 (eight) hours as needed. 60 tablet 1   hydrOXYzine (VISTARIL) 25 MG capsule Take 1 capsule (25 mg total) by mouth 3 (three) times daily as needed. 60 capsule 3   mometasone (ELOCON) 0.1 % cream Apply 1 application topically daily. 50 g 1   No facility-administered medications prior to visit.    No Known Allergies     Objective:    BP 120/71    Pulse 77    Ht 5\' 1"  (1.549 m)    Wt 188 lb 6 oz (85.4 kg)    LMP 10/09/2017    SpO2 98%    BMI 35.59 kg/m  Wt Readings from Last 3 Encounters:  12/07/21 188 lb 6 oz (85.4 kg)  10/05/21 182 lb 6 oz (82.7 kg)  06/04/20 192 lb (87.1 kg)  Physical Exam Exam conducted with a chaperone present.  Constitutional:      Appearance: She is well-developed.  HENT:     Head: Normocephalic.  Cardiovascular:     Rate and Rhythm: Normal rate and regular rhythm.     Heart sounds: Normal heart sounds.  Pulmonary:     Effort: Pulmonary effort is normal.     Breath sounds: Normal breath sounds.  Abdominal:     General: Bowel sounds are normal.     Palpations: Abdomen is soft.     Hernia: There is no hernia in the left inguinal area.  Genitourinary:    Exam position: Lithotomy position.     Labia:        Right: No rash, tenderness, lesion or injury.        Left: No rash, tenderness, lesion or injury.      Vagina: Normal. No signs of injury and foreign body. No vaginal discharge, erythema, tenderness or bleeding.     Cervix: No cervical  motion tenderness or friability.     Uterus: Not deviated and not enlarged.      Adnexa:        Right: No mass, tenderness or fullness.         Left: No mass, tenderness or fullness.       Rectum: Normal. No external hemorrhoid.  Lymphadenopathy:     Lower Body: No right inguinal adenopathy. No left inguinal adenopathy.  Skin:    General: Skin is warm and dry.     Findings: Erythema and rash present. Rash is macular.  Neurological:     Mental Status: She is alert and oriented to person, place, and time.  Psychiatric:        Behavior: Behavior normal.        Thought Content: Thought content normal.        Judgment: Judgment normal.         Patient has been counseled extensively about nutrition and exercise as well as the importance of adherence with medications and regular follow-up. The patient was given clear instructions to go to ER or return to medical center if symptoms don't improve, worsen or new problems develop. The patient verbalized understanding.   Follow-up: Return in about 3 months (around 03/07/2022) for HTN.   Gildardo Pounds, FNP-BC [facility] and [place], [place]   12/07/2021, 1:01 PM

## 2021-12-08 ENCOUNTER — Other Ambulatory Visit: Payer: Self-pay

## 2021-12-08 LAB — CERVICOVAGINAL ANCILLARY ONLY
Bacterial Vaginitis (gardnerella): POSITIVE — AB
Candida Glabrata: NEGATIVE
Candida Vaginitis: NEGATIVE
Chlamydia: NEGATIVE
Comment: NEGATIVE
Comment: NEGATIVE
Comment: NEGATIVE
Comment: NEGATIVE
Comment: NEGATIVE
Comment: NORMAL
Neisseria Gonorrhea: NEGATIVE
Trichomonas: NEGATIVE

## 2021-12-08 LAB — HIV ANTIBODY (ROUTINE TESTING W REFLEX): HIV Screen 4th Generation wRfx: NONREACTIVE

## 2021-12-09 ENCOUNTER — Other Ambulatory Visit: Payer: Self-pay

## 2021-12-09 ENCOUNTER — Other Ambulatory Visit: Payer: Self-pay | Admitting: Nurse Practitioner

## 2021-12-09 DIAGNOSIS — B9689 Other specified bacterial agents as the cause of diseases classified elsewhere: Secondary | ICD-10-CM

## 2021-12-09 MED ORDER — METRONIDAZOLE 500 MG PO TABS
500.0000 mg | ORAL_TABLET | Freq: Two times a day (BID) | ORAL | 0 refills | Status: AC
  Filled 2021-12-09: qty 14, 7d supply, fill #0

## 2021-12-10 ENCOUNTER — Telehealth: Payer: Self-pay

## 2021-12-10 LAB — CYTOLOGY - PAP
Comment: NEGATIVE
Diagnosis: NEGATIVE
Diagnosis: REACTIVE
High risk HPV: NEGATIVE

## 2021-12-10 NOTE — Telephone Encounter (Signed)
-----   Message from Claiborne Rigg, NP sent at 12/09/2021  8:22 AM EST ----- Vaginal swab positive for bacterial vaginosis.  Prescription has been sent to the pharmacy.  HIV blood test is negative

## 2021-12-10 NOTE — Telephone Encounter (Signed)
Called patient reviewed all information and repeated back to me. Will call if any questions.  With the help of Donnal Debar # [phone]

## 2021-12-14 ENCOUNTER — Other Ambulatory Visit: Payer: Self-pay

## 2021-12-17 ENCOUNTER — Ambulatory Visit
Admission: RE | Admit: 2021-12-17 | Discharge: 2021-12-17 | Disposition: A | Discharge status: Home / Self Care | Payer: No Typology Code available for payment source | Source: Ambulatory Visit | Attending: Nurse Practitioner | Admitting: Nurse Practitioner

## 2021-12-17 ENCOUNTER — Other Ambulatory Visit: Payer: Self-pay

## 2021-12-17 DIAGNOSIS — Z1231 Encounter for screening mammogram for malignant neoplasm of breast: Secondary | ICD-10-CM

## 2021-12-17 HISTORY — DX: Other signs and symptoms in breast: N64.59

## 2021-12-28 ENCOUNTER — Other Ambulatory Visit: Payer: Self-pay

## 2021-12-30 ENCOUNTER — Telehealth: Payer: Self-pay | Admitting: Nurse Practitioner

## 2021-12-30 NOTE — Telephone Encounter (Signed)
I call the PT to inform that we need to reschedule her appt for 03/03/22 since provider won't  be in the office, unable to LVM she does not have one

## 2022-01-15 ENCOUNTER — Telehealth: Payer: Self-pay | Admitting: Nurse Practitioner

## 2022-01-15 NOTE — Telephone Encounter (Signed)
Copied from [place] [phone]. Topic: General - Other >> Jan 15, 2022 12:23 PM Bayard Beaver wrote: Reason for CRM: pt called in needs to renew orange card.

## 2022-01-19 NOTE — Telephone Encounter (Addendum)
Pt calling back to follow up requesting a call back.  ? ? ?

## 2022-02-14 ENCOUNTER — Encounter (HOSPITAL_COMMUNITY): Payer: Self-pay

## 2022-02-14 ENCOUNTER — Other Ambulatory Visit: Payer: Self-pay

## 2022-02-14 ENCOUNTER — Emergency Department (HOSPITAL_COMMUNITY)
Admission: EM | Admit: 2022-02-14 | Discharge: 2022-02-14 | Disposition: A | Discharge status: Home / Self Care | Payer: No Typology Code available for payment source | Attending: Emergency Medicine | Admitting: Emergency Medicine

## 2022-02-14 DIAGNOSIS — R000 Tachycardia, unspecified: Secondary | ICD-10-CM | POA: Insufficient documentation

## 2022-02-14 DIAGNOSIS — R112 Nausea with vomiting, unspecified: Secondary | ICD-10-CM | POA: Insufficient documentation

## 2022-02-14 DIAGNOSIS — R1013 Epigastric pain: Secondary | ICD-10-CM | POA: Insufficient documentation

## 2022-02-14 DIAGNOSIS — D72829 Elevated white blood cell count, unspecified: Secondary | ICD-10-CM | POA: Insufficient documentation

## 2022-02-14 DIAGNOSIS — R197 Diarrhea, unspecified: Secondary | ICD-10-CM | POA: Insufficient documentation

## 2022-02-14 LAB — I-STAT BETA HCG BLOOD, ED (MC, WL, AP ONLY): I-stat hCG, quantitative: <5 m[IU]/mL (ref <5)

## 2022-02-14 LAB — URINALYSIS, ROUTINE W REFLEX MICROSCOPIC
Bilirubin Urine: NEGATIVE
Glucose, UA: NEGATIVE mg/dL
Hgb urine dipstick: NEGATIVE
Ketones, ur: NEGATIVE mg/dL
Leukocytes,Ua: NEGATIVE
Nitrite: NEGATIVE
Protein, ur: NEGATIVE mg/dL
Specific Gravity, Urine: 1.016 (ref 1.005–1.030)
pH: 7 (ref 5.0–8.0)

## 2022-02-14 LAB — COMPREHENSIVE METABOLIC PANEL
ALT: 38 U/L (ref 0–44)
AST: 30 U/L (ref 15–41)
Albumin: 4.1 g/dL (ref 3.5–5.0)
Alkaline Phosphatase: 123 U/L (ref 38–126)
Anion gap: 9 (ref 5–15)
BUN: 16 mg/dL (ref 6–20)
CO2: 25 mmol/L (ref 22–32)
Calcium: 9.1 mg/dL (ref 8.9–10.3)
Chloride: 105 mmol/L (ref 98–111)
Creatinine, Ser: 0.92 mg/dL (ref 0.44–1.00)
GFR, Estimated: >60 mL/min (ref >60)
Glucose, Bld: 106 mg/dL — ABNORMAL HIGH (ref 70–99)
Potassium: 3.9 mmol/L (ref 3.5–5.1)
Sodium: 139 mmol/L (ref 135–145)
Total Bilirubin: 0.7 mg/dL (ref 0.3–1.2)
Total Protein: 8.3 g/dL — ABNORMAL HIGH (ref 6.5–8.1)

## 2022-02-14 LAB — CBC
HCT: 46.7 % — ABNORMAL HIGH (ref 36.0–46.0)
Hemoglobin: 15.4 g/dL — ABNORMAL HIGH (ref 12.0–15.0)
MCH: 28.6 pg (ref 26.0–34.0)
MCHC: 33 g/dL (ref 30.0–36.0)
MCV: 86.8 fL (ref 80.0–100.0)
Platelets: 241 10*3/uL (ref 150–400)
RBC: 5.38 MIL/uL — ABNORMAL HIGH (ref 3.87–5.11)
RDW: 12.3 % (ref 11.5–15.5)
WBC: 10.8 10*3/uL — ABNORMAL HIGH (ref 4.0–10.5)
nRBC: 0 % (ref 0.0–0.2)

## 2022-02-14 LAB — LIPASE, BLOOD: Lipase: 38 U/L (ref 11–51)

## 2022-02-14 MED ORDER — SODIUM CHLORIDE 0.9 % IV BOLUS
1000.0000 mL | Freq: Once | INTRAVENOUS | Status: AC
  Administered 2022-02-14: 1000 mL via INTRAVENOUS

## 2022-02-14 MED ORDER — ONDANSETRON HCL 4 MG PO TABS: 4.0000 mg | ORAL_TABLET | Freq: Three times a day (TID) | ORAL | 0 refills | Status: DC | PRN

## 2022-02-14 MED ORDER — ONDANSETRON HCL 4 MG/2ML IJ SOLN
4.0000 mg | Freq: Once | INTRAMUSCULAR | Status: AC
  Administered 2022-02-14: 4 mg via INTRAVENOUS
  Filled 2022-02-14: qty 2

## 2022-02-14 NOTE — ED Notes (Signed)
E-signature pad unavailable at time of pt discharge. This RN discussed discharge materials with pt and answered all pt questions. Pt stated understanding of discharge material. ? ?

## 2022-02-14 NOTE — ED Notes (Signed)
Pt given cranberry juice for fluid/PO challenge. RN utilized Radiation protection practitioner to remind pt of need for urine sample when she feels that she is able to urinate.  ?

## 2022-02-14 NOTE — ED Provider Notes (Signed)
?[facility] EMERGENCY DEPARTMENT ?Provider Note ? ? ?CSN: [id number] ?Arrival date & time: 02/14/22  1753 ? ?  ? ?History ? ?Chief Complaint  ?Patient presents with  ? Emesis  ? ? ?[name] is a 51 y.o. female.  She is here with a complaint of abdominal pain nausea vomiting diarrhea.  Symptoms started few hours ago.  She said she has vomited 6 times nonbloody.  1 episode diarrhea.  Generalized abdominal pain.  No fevers or chills.  She has multiple family members are sick with same.  No recent travel.  Has tried nothing for her symptoms. ? ?The history is provided by the patient. The history is limited by a language barrier. A language interpreter was used.  ?Emesis ?Severity:  Moderate ?Duration:  1 hour ?Timing:  Intermittent ?Number of daily episodes:  6 ?Quality:  Stomach contents ?Progression:  Unchanged ?Chronicity:  New ?Recent urination:  Normal ?Relieved by:  None tried ?Worsened by:  Nothing ?Ineffective treatments:  None tried ?Associated symptoms: abdominal pain and diarrhea   ?Associated symptoms: no cough, no fever, no headaches and no sore throat   ?Risk factors: sick contacts   ? ?  ? ?Home Medications ?Prior to Admission medications   ?Medication Sig Start Date End Date Taking? Authorizing Provider  ?amLODipine (NORVASC) 5 MG tablet TAKE 1 TABLET (5 MG TOTAL) BY MOUTH DAILY. 10/05/21 10/05/22  Claiborne Rigg, NP  ?atorvastatin (LIPITOR) 20 MG tablet Take 1 tablet (20 mg total) by mouth daily. 10/05/21   Claiborne Rigg, NP  ?cyclobenzaprine (FLEXERIL) 10 MG tablet Take 1 tablet (10 mg total) by mouth 3 (three) times daily as needed for muscle spasms. 10/05/21   Claiborne Rigg, NP  ?hydrOXYzine (VISTARIL) 25 MG capsule Take 1 capsule (25 mg total) by mouth 3 (three) times daily as needed. 12/07/21   Claiborne Rigg, NP  ?ibuprofen (ADVIL) 600 MG tablet Take 1 tablet (600 mg total) by mouth every 8 (eight) hours as needed. 12/12/19   Claiborne Rigg, NP   ?mometasone (ELOCON) 0.1 % cream Apply 1 application topically daily. 12/07/21   Claiborne Rigg, NP  ?   ? ?Allergies    ?Patient has no known allergies.   ? ?Review of Systems   ?Review of Systems  ?Constitutional:  Negative for fever.  ?HENT:  Negative for sore throat.   ?Respiratory:  Negative for cough.   ?Cardiovascular:  Negative for chest pain.  ?Gastrointestinal:  Positive for abdominal pain, diarrhea and vomiting.  ?Skin:  Negative for rash.  ?Neurological:  Negative for headaches.  ? ?Physical Exam ?Updated Vital Signs ?BP 124/90   Pulse (!) 121   Temp 97.8 ?F (36.6 ?C) (Oral)   Resp 16   LMP 10/09/2017   SpO2 100%  ?Physical Exam ?Vitals and nursing note reviewed.  ?Constitutional:   ?   General: She is not in acute distress. ?   Appearance: Normal appearance. She is well-developed.  ?HENT:  ?   Head: Normocephalic and atraumatic.  ?Eyes:  ?   Conjunctiva/sclera: Conjunctivae normal.  ?Cardiovascular:  ?   Rate and Rhythm: Regular rhythm. Tachycardia present.  ?   Heart sounds: No murmur heard. ?Pulmonary:  ?   Effort: Pulmonary effort is normal. No respiratory distress.  ?   Breath sounds: Normal breath sounds.  ?Abdominal:  ?   Palpations: Abdomen is soft.  ?   Tenderness: There is no abdominal tenderness. There is no guarding or rebound.  ?  Musculoskeletal:     ?   General: No swelling.  ?   Cervical back: Neck supple.  ?Skin: ?   General: Skin is warm and dry.  ?   Capillary Refill: Capillary refill takes less than 2 seconds.  ?Neurological:  ?   General: No focal deficit present.  ?   Mental Status: She is alert.  ? ? ?ED Results / Procedures / Treatments   ?Labs ?(all labs ordered are listed, but only abnormal results are displayed) ?Labs Reviewed  ?COMPREHENSIVE METABOLIC PANEL - Abnormal; Notable for the following components:  ?    Result Value  ? Glucose, Bld 106 (*)   ? Total Protein 8.3 (*)   ? All other components within normal limits  ?CBC - Abnormal; Notable for the following  components:  ? WBC 10.8 (*)   ? RBC 5.38 (*)   ? Hemoglobin 15.4 (*)   ? HCT 46.7 (*)   ? All other components within normal limits  ?LIPASE, BLOOD  ?URINALYSIS, ROUTINE W REFLEX MICROSCOPIC  ?I-STAT BETA HCG BLOOD, ED ([facility], [facility], [facility] ONLY)  ? ? ?EKG ?None ? ?Radiology ?No results found. ? ?Procedures ?Procedures  ? ? ?Medications Ordered in ED ?Medications  ?sodium chloride 0.9 % bolus 1,000 mL (has no administration in time range)  ?ondansetron (ZOFRAN) injection 4 mg (has no administration in time range)  ? ? ?ED Course/ Medical Decision Making/ A&P ?Clinical Course as of 02/15/22 0952  ?Wynelle Link Feb 14, 2022  ?2025 Patient feels better and is tolerating p.o.  She is comfortable plan for discharge. [MB]  ?  ?Clinical Course User Index ?[MB] Terrilee Files, MD  ? ?                        ?Medical Decision Making ?Amount and/or Complexity of Data Reviewed ?Labs: ordered. ? ?Risk ?Prescription drug management. ? ?This patient complains of abdominal pain nausea vomiting diarrhea; this involves an extensive number of treatment ?Options and is a complaint that carries with it a high risk of complications and ?morbidity. The differential includes gastroenteritis, obstruction, metabolic derangement gastritis, dehydration, infection ? ?I ordered, reviewed and interpreted labs, which included CBC evaluated white count stable hemoglobin, chemistries and LFTs unremarkable, urinalysis without signs of infection, pregnancy test negative ?I ordered medication IV fluids and nausea medication with improvement in her symptoms and reviewed PMP when indicated. ? ?Previous records obtained and reviewed in epic no recent admissions ?Cardiac monitoring reviewed, normal sinus rhythm ?Social determinants considered, no significant barriers ?Critical Interventions: None ? ?After the interventions stated above, I reevaluated the patient and found patient to be symptomatically improved. ?Admission and further testing considered, no indications  for admission at this time.  She has multiple family members sick with similar symptoms.  Likely acute gastroenteritis.  Advised treatment and return instructions discussed. ? ? ? ? ? ? ? ? ? ?Final Clinical Impression(s) / ED Diagnoses ?Final diagnoses:  ?Nausea vomiting and diarrhea  ?Epigastric pain  ? ? ?Rx / DC Orders ?ED Discharge Orders   ? ?      Ordered  ?  ondansetron (ZOFRAN) 4 MG tablet  Every 8 hours PRN       ? 02/14/22 2029  ? ?  ?  ? ?  ? ? ?  ?Terrilee Files, MD ?02/15/22 [phone] ? ?

## 2022-02-14 NOTE — ED Triage Notes (Signed)
Pt reports vomiting and diarrhea that started today associated with abd pain ?

## 2022-02-14 NOTE — Discharge Instructions (Signed)
You were seen in the emergency department for evaluation of nausea vomiting diarrhea and abdominal pain.  Because other family members have a similar symptoms this is likely a virus or food poisoning.  We are prescribing you some nausea medication.  Please continue with clear liquid diet and advance as tolerated.  Follow-up with your doctor.  Return to the emergency department if any worsening or concerning symptoms ?

## 2022-02-14 NOTE — ED Notes (Signed)
Pt ambulated self to bathroom with family member. Urine cup provided for urine specimen ?

## 2022-03-03 ENCOUNTER — Ambulatory Visit: Payer: No Typology Code available for payment source | Admitting: Nurse Practitioner

## 2022-03-10 ENCOUNTER — Other Ambulatory Visit: Payer: Self-pay

## 2022-03-10 ENCOUNTER — Emergency Department (HOSPITAL_COMMUNITY): Payer: No Typology Code available for payment source

## 2022-03-10 ENCOUNTER — Encounter (HOSPITAL_COMMUNITY): Payer: Self-pay

## 2022-03-10 ENCOUNTER — Emergency Department (HOSPITAL_COMMUNITY)
Admission: EM | Admit: 2022-03-10 | Discharge: 2022-03-10 | Disposition: A | Discharge status: Home / Self Care | Payer: No Typology Code available for payment source | Attending: Emergency Medicine | Admitting: Emergency Medicine

## 2022-03-10 ENCOUNTER — Ambulatory Visit: Payer: Self-pay | Admitting: *Deleted

## 2022-03-10 DIAGNOSIS — R112 Nausea with vomiting, unspecified: Secondary | ICD-10-CM

## 2022-03-10 DIAGNOSIS — R1010 Upper abdominal pain, unspecified: Secondary | ICD-10-CM | POA: Insufficient documentation

## 2022-03-10 DIAGNOSIS — I10 Essential (primary) hypertension: Secondary | ICD-10-CM | POA: Insufficient documentation

## 2022-03-10 DIAGNOSIS — R1013 Epigastric pain: Secondary | ICD-10-CM | POA: Insufficient documentation

## 2022-03-10 DIAGNOSIS — Z79899 Other long term (current) drug therapy: Secondary | ICD-10-CM | POA: Insufficient documentation

## 2022-03-10 LAB — URINALYSIS, ROUTINE W REFLEX MICROSCOPIC
Bilirubin Urine: NEGATIVE
Glucose, UA: NEGATIVE mg/dL
Hgb urine dipstick: NEGATIVE
Ketones, ur: NEGATIVE mg/dL
Leukocytes,Ua: NEGATIVE
Nitrite: NEGATIVE
Protein, ur: NEGATIVE mg/dL
Specific Gravity, Urine: 1.021 (ref 1.005–1.030)
pH: 7 (ref 5.0–8.0)

## 2022-03-10 LAB — CBC
HCT: 46.3 % — ABNORMAL HIGH (ref 36.0–46.0)
Hemoglobin: 15.3 g/dL — ABNORMAL HIGH (ref 12.0–15.0)
MCH: 28.5 pg (ref 26.0–34.0)
MCHC: 33 g/dL (ref 30.0–36.0)
MCV: 86.2 fL (ref 80.0–100.0)
Platelets: 265 10*3/uL (ref 150–400)
RBC: 5.37 MIL/uL — ABNORMAL HIGH (ref 3.87–5.11)
RDW: 12.5 % (ref 11.5–15.5)
WBC: 7.7 10*3/uL (ref 4.0–10.5)
nRBC: 0 % (ref 0.0–0.2)

## 2022-03-10 LAB — COMPREHENSIVE METABOLIC PANEL
ALT: 40 U/L (ref 0–44)
AST: 35 U/L (ref 15–41)
Albumin: 4 g/dL (ref 3.5–5.0)
Alkaline Phosphatase: 114 U/L (ref 38–126)
Anion gap: 7 (ref 5–15)
BUN: 13 mg/dL (ref 6–20)
CO2: 28 mmol/L (ref 22–32)
Calcium: 9.3 mg/dL (ref 8.9–10.3)
Chloride: 106 mmol/L (ref 98–111)
Creatinine, Ser: 0.65 mg/dL (ref 0.44–1.00)
GFR, Estimated: >60 mL/min (ref >60)
Glucose, Bld: 129 mg/dL — ABNORMAL HIGH (ref 70–99)
Potassium: 4.4 mmol/L (ref 3.5–5.1)
Sodium: 141 mmol/L (ref 135–145)
Total Bilirubin: 0.5 mg/dL (ref 0.3–1.2)
Total Protein: 8.5 g/dL — ABNORMAL HIGH (ref 6.5–8.1)

## 2022-03-10 LAB — LIPASE, BLOOD: Lipase: 31 U/L (ref 11–51)

## 2022-03-10 LAB — I-STAT BETA HCG BLOOD, ED (MC, WL, AP ONLY): I-stat hCG, quantitative: <5 m[IU]/mL (ref <5)

## 2022-03-10 MED ORDER — SODIUM CHLORIDE 0.9 % IV BOLUS
1000.0000 mL | Freq: Once | INTRAVENOUS | Status: AC
  Administered 2022-03-10: 1000 mL via INTRAVENOUS

## 2022-03-10 MED ORDER — MORPHINE SULFATE (PF) 4 MG/ML IV SOLN
4.0000 mg | Freq: Once | INTRAVENOUS | Status: AC
  Administered 2022-03-10: 4 mg via INTRAVENOUS
  Filled 2022-03-10: qty 1

## 2022-03-10 MED ORDER — ALUM & MAG HYDROXIDE-SIMETH 200-200-20 MG/5ML PO SUSP
30.0000 mL | Freq: Once | ORAL | Status: AC
  Administered 2022-03-10: 30 mL via ORAL
  Filled 2022-03-10: qty 30

## 2022-03-10 MED ORDER — ONDANSETRON HCL 4 MG PO TABS
4.0000 mg | ORAL_TABLET | Freq: Three times a day (TID) | ORAL | 0 refills | Status: DC | PRN
  Filled 2022-03-10: qty 12, 4d supply, fill #0

## 2022-03-10 MED ORDER — ONDANSETRON HCL 4 MG/2ML IJ SOLN
4.0000 mg | Freq: Once | INTRAMUSCULAR | Status: AC
  Administered 2022-03-10: 4 mg via INTRAVENOUS
  Filled 2022-03-10: qty 2

## 2022-03-10 NOTE — ED Triage Notes (Signed)
Pt reports upper abdominal pain with N/V since yesterday. Pt reports vomiting 7-8 times since yesterday.  ?

## 2022-03-10 NOTE — Telephone Encounter (Signed)
?  Chief Complaint: Vomiting ?Symptoms: Vomited 7 Xs since last night, bitter bile, weak, dizzy, intermittent upper abdominal pain. Cannot keep fluids down ?Frequency: last night ?Pertinent Negatives: Patient denies  ?Disposition: [x] ED /[] Urgent Care (no appt availability in office) / [] Appointment(In office/virtual)/ []  [place] [facility]/ [] Home Care/ [] Refused Recommended Disposition /[] [place] [facility]/ []  Follow-up with PCP ?Additional Notes: States will follow disposition. Assisted by Interpreter .' ?Reason for Disposition ? [1] SEVERE vomiting (e.g., 6 or more times/day) AND [2] present > 8 hours (Exception: patient sounds well, is drinking liquids, does not sound dehydrated, and vomiting has lasted less than 24 hours) ? ?Answer Assessment - Initial Assessment Questions ?1. VOMITING SEVERITY: "How many times have you vomited in the past 24 hours?"  ?   - MILD:  1 - 2 times/day ?   - MODERATE: 3 - 5 times/day, decreased oral intake without significant weight loss or symptoms of dehydration ?   - SEVERE: 6 or more times/day, vomits everything or nearly everything, with significant weight loss, symptoms of dehydration  ?    7  ?2. ONSET: "When did the vomiting begin?"  ?    Last night ?3. FLUIDS: "What fluids or food have you vomited up today?" "Have you been able to keep any fluids down?" ?    No ?4. ABDOMINAL PAIN: "Are your having any abdominal pain?" If yes : "How bad is it and what does it feel like?" (e.g., crampy, dull, intermittent, constant)  ?    "Feels full comes and goes" ?5. DIARRHEA: "Is there any diarrhea?" If Yes, ask: "How many times today?"  ?    no ?6. CONTACTS: "Is there anyone else in the family with the same symptoms?"  ?    1st of April family had symptoms ?7. CAUSE: "What do you think is causing your vomiting?" ?    Unsure ?8. HYDRATION STATUS: "Any signs of dehydration?" (e.g., dry mouth [not only dry lips], too weak to stand) "When did you last urinate?" ?    Weak, ?9.  OTHER SYMPTOMS: "Do you have any other symptoms?" (e.g., fever, headache, vertigo, vomiting blood or coffee grounds, recent head injury) ?    Weak, dizziness, bitter bile ? ?Protocols used: Vomiting-A-AH ? ?

## 2022-03-10 NOTE — Telephone Encounter (Signed)
Pt is currently at the ED

## 2022-03-10 NOTE — ED Notes (Signed)
US at bedside

## 2022-03-10 NOTE — ED Provider Triage Note (Signed)
Emergency Medicine Provider Triage Evaluation Note ? ?[name] , a 51 y.o. female  was evaluated in triage.  Pt complains of abdominal pain, nausea and vomiting.  Says that this started yesterday after she ate a [employer] sandwich.  Says that she vomited all night and is only getting bile out now.  Says that the abdominal pain comes and goes.  No history of abdominal surgery.  Called her PCP who was unable to get her in and told her because she vomited a lot she needs to go to the emergency department. ? ?Review of Systems  ?Positive: As above ?Negative: Urinary symptoms, diarrhea, fever, chills ? ?Physical Exam  ?BP (!) 135/101 (BP Location: Right Arm)   Pulse (!) 115   Temp 98.5 ?F (36.9 ?C) (Oral)   Resp 16   Ht 5\' 1"  (1.549 m)   Wt 85.3 kg   LMP 10/09/2017   SpO2 99%   BMI 35.52 kg/m?  ?Gen:   Awake, no distress   ?Resp:  Normal effort  ?MSK:   Moves extremities without difficulty  ?Other:  Epigastric tenderness, negative Murphy's and McBurney's ? ?Medical Decision Making  ?Medically screening exam initiated at 9:49 AM.  Appropriate orders placed.  [name] was informed that the remainder of the evaluation will be completed by another provider, this initial triage assessment does not replace that evaluation, and the importance of remaining in the ED until their evaluation is complete. ? ? ?  ?Gentry Pilson A, PA-C ?03/10/22 1001 ? ?

## 2022-03-10 NOTE — ED Provider Notes (Signed)
?[facility] ?Provider Note ? ? ?CSN: [id number] ?Arrival date & time: 03/10/22  0913 ? ?  ? ?History ? ?Chief Complaint  ?Patient presents with  ? Abdominal Pain  ? ? ?[name] is a 51 y.o. female. ? ?The history is provided by the patient and medical records. The history is limited by a language barrier. A language interpreter was used.  ?Abdominal Pain ? ?51 year old Hispanic speaking female with history of hypertension and GERD who presents with complaint of abdominal pain.  History obtained through language interpreter.  Patient reports after eating dinner last night she developed upper abdominal discomfort that she described as a achy throbbing sensation with persistent nausea and has had multiple episodes of nonbloody nonbilious vomiting throughout the night and this morning.  Pain has improved but still endorses some discomfort.  No report of fever or chills no chest pain or shortness of breath or cough no dysuria and no back pain.  Normal bowel movement. ? ? ?Home Medications ?Prior to Admission medications   ?Medication Sig Start Date End Date Taking? Authorizing Provider  ?amLODipine (NORVASC) 5 MG tablet TAKE 1 TABLET (5 MG TOTAL) BY MOUTH DAILY. 10/05/21 10/05/22  Claiborne RiggFleming, Zelda W, NP  ?atorvastatin (LIPITOR) 20 MG tablet Take 1 tablet (20 mg total) by mouth daily. 10/05/21   Claiborne RiggFleming, Zelda W, NP  ?cyclobenzaprine (FLEXERIL) 10 MG tablet Take 1 tablet (10 mg total) by mouth 3 (three) times daily as needed for muscle spasms. 10/05/21   Claiborne RiggFleming, Zelda W, NP  ?hydrOXYzine (VISTARIL) 25 MG capsule Take 1 capsule (25 mg total) by mouth 3 (three) times daily as needed. 12/07/21   Claiborne RiggFleming, Zelda W, NP  ?ibuprofen (ADVIL) 600 MG tablet Take 1 tablet (600 mg total) by mouth every 8 (eight) hours as needed. 12/12/19   Claiborne RiggFleming, Zelda W, NP  ?mometasone (ELOCON) 0.1 % cream Apply 1 application topically daily. 12/07/21   Claiborne RiggFleming, Zelda W, NP  ?ondansetron  (ZOFRAN) 4 MG tablet Take 1 tablet (4 mg total) by mouth every 8 (eight) hours as needed for nausea or vomiting. 02/14/22   Terrilee FilesButler, Michael C, MD  ?   ? ?Allergies    ?Patient has no known allergies.   ? ?Review of Systems   ?Review of Systems  ?Gastrointestinal:  Positive for abdominal pain.  ?All other systems reviewed and are negative. ? ?Physical Exam ?Updated Vital Signs ?BP 117/79 (BP Location: Right Arm)   Pulse (!) 103   Temp 98.5 ?F (36.9 ?C) (Oral)   Resp 20   Ht 5\' 1"  (1.549 m)   Wt 85.3 kg   LMP 10/09/2017   SpO2 98%   BMI 35.52 kg/m?  ?Physical Exam ?Vitals and nursing note reviewed.  ?Constitutional:   ?   General: She is not in acute distress. ?   Appearance: She is well-developed. She is obese.  ?HENT:  ?   Head: Atraumatic.  ?Eyes:  ?   Conjunctiva/sclera: Conjunctivae normal.  ?Pulmonary:  ?   Effort: Pulmonary effort is normal.  ?Abdominal:  ?   Tenderness: There is abdominal tenderness in the epigastric area. There is no guarding or rebound. Negative signs include Murphy's sign, Rovsing's sign and McBurney's sign.  ?Musculoskeletal:  ?   Cervical back: Neck supple.  ?Skin: ?   Findings: No rash.  ?Neurological:  ?   Mental Status: She is alert.  ?Psychiatric:     ?   Mood and Affect: Mood normal.  ? ? ?ED  Results / Procedures / Treatments   ?Labs ?(all labs ordered are listed, but only abnormal results are displayed) ?Labs Reviewed  ?COMPREHENSIVE METABOLIC PANEL - Abnormal; Notable for the following components:  ?    Result Value  ? Glucose, Bld 129 (*)   ? Total Protein 8.5 (*)   ? All other components within normal limits  ?CBC - Abnormal; Notable for the following components:  ? RBC 5.37 (*)   ? Hemoglobin 15.3 (*)   ? HCT 46.3 (*)   ? All other components within normal limits  ?LIPASE, BLOOD  ?URINALYSIS, ROUTINE W REFLEX MICROSCOPIC  ?I-STAT BETA HCG BLOOD, ED ([facility], [facility], [facility] ONLY)  ? ? ?EKG ?None ? ?Radiology ?No results found. ? ?Procedures ?Procedures  ? ? ?Medications Ordered in  ED ?Medications - No data to display ? ?ED Course/ Medical Decision Making/ A&P ?  ?                        ?Medical Decision Making ? ?BP 117/79 (BP Location: Right Arm)   Pulse (!) 103   Temp 98.5 ?F (36.9 ?C) (Oral)   Resp 20   Ht 5\' 1"  (1.549 m)   Wt 85.3 kg   LMP 10/09/2017   SpO2 98%   BMI 35.52 kg/m?  ? ?57:76 PM ?51 year old Hispanic female here with epigastric abdominal discomfort that started last night after eating.  She also had multiple episodes of nausea and vomiting as well.  She has normal bowel movement.  Symptom is improving but still endorse some discomfort.  On exam she has epigastric tenderness without significant right upper quadrant tenderness.  No pain to her left upper quadrant on exam.  Given her presentation, labs ordered, will obtain limited abdominal ultrasound to rule out biliary disease.  Patient given morphine for pain control, Zofran for nausea, and IV fluid as she is tachycardic with heart rate of 103. ? ?5:30 PM ?Labs and imaging obtained independently reviewed interpreted by me.  Pregnancy test is negative, normal lipase, electrolyte panels are reassuring, normal WBC, normal H&H, urinalysis without signs of urinary tract infection, and a limited abdominal ultrasound without any evidence of gallstones or biliary ductal dilatation.  Patient was given symptomatic treatment which include morphine, Zofran, and IV fluid as well as GI cocktail.  On reassessment patient reported feeling much better.  I suspect her symptoms could be due to food poisoning or perhaps acid reflux.  I do not think patient has any acute emergent medical condition at this time.  Given improvement of symptoms, encourage patient to follow-up outpatient with PCP, return precaution given.  All questions answered to patient satisfaction ? ?This patient presents to the ED for concern of abd pain, this involves an extensive number of treatment options, and is a complaint that carries with it a high risk of  complications and morbidity.  The differential diagnosis includes gastritis, GERD, GI illness, cholecystitis, pancreatitis, colitis, diverticulitis, UTI ? ?Co morbidities that complicate the patient evaluation ?obesity ?Additional history obtained: ? ?Additional history obtained from patient and using language interpreter ?External records from outside source obtained and reviewed including notes from internal medicine from prior visits ? ?Lab Tests: ? ?I Ordered, and personally interpreted labs.  The pertinent results include:  as discussed above ? ?Imaging Studies ordered: ? ?I ordered imaging studies including limited abd 44 ?I independently visualized and interpreted imaging which showed no acute finding ?I agree with the radiologist interpretation ? ?Cardiac Monitoring: ? ?  The patient was maintained on a cardiac monitor.  I personally viewed and interpreted the cardiac monitored which showed an underlying rhythm of: NSR ? ?Medicines ordered and prescription drug management: ? ?I ordered medication including zofran/morphine/ivf/gi cocktail  for abd pain ?Reevaluation of the patient after these medicines showed that the patient improved ?I have reviewed the patients home medicines and have made adjustments as needed ? ?Test Considered: ?abd/pelvis CT but felt it is low yield ? ?Critical Interventions: ?opiate pain medication ? Antiemetic ? IVF ? GI cocktail ? ? ?Problem List / ED Course: ?upper abd pain ? Nausea & vomiting ? ?Reevaluation: ? ?After the interventions noted above, I reevaluated the patient and found that they have :improved ? ?Social Determinants of Health: ?language barrier ? ?Dispostion: ? ?After consideration of the diagnostic results and the patients response to treatment, I feel that the patent would benefit from outpt f/u. ? ? ? ? ? ? ? ? ?Final Clinical Impression(s) / ED Diagnoses ?Final diagnoses:  ?Nausea and vomiting, unspecified vomiting type  ? ? ?Rx / DC Orders ?ED Discharge Orders    ? ?      Ordered  ?  ondansetron (ZOFRAN) 4 MG tablet  Every 8 hours PRN       ? 03/10/22 1736  ? ?  ?  ? ?  ? ? ?  ?Fayrene Helper, PA-C ?03/10/22 1736 ? ?  ?Benjiman Core, MD ?03/11/22 0009 ? ?

## 2022-03-11 ENCOUNTER — Other Ambulatory Visit: Payer: Self-pay

## 2022-03-24 ENCOUNTER — Ambulatory Visit: Payer: No Typology Code available for payment source | Admitting: Nurse Practitioner

## 2022-06-01 ENCOUNTER — Other Ambulatory Visit: Payer: Self-pay

## 2022-06-04 ENCOUNTER — Other Ambulatory Visit: Payer: Self-pay

## 2022-07-27 ENCOUNTER — Emergency Department (HOSPITAL_COMMUNITY)
Admission: EM | Admit: 2022-07-27 | Discharge: 2022-07-27 | Disposition: A | Discharge status: Home / Self Care | Payer: No Typology Code available for payment source | Attending: Emergency Medicine | Admitting: Emergency Medicine

## 2022-07-27 ENCOUNTER — Emergency Department (HOSPITAL_COMMUNITY): Payer: No Typology Code available for payment source

## 2022-07-27 ENCOUNTER — Ambulatory Visit: Payer: Self-pay

## 2022-07-27 DIAGNOSIS — R079 Chest pain, unspecified: Secondary | ICD-10-CM | POA: Insufficient documentation

## 2022-07-27 DIAGNOSIS — I10 Essential (primary) hypertension: Secondary | ICD-10-CM | POA: Insufficient documentation

## 2022-07-27 LAB — CBC WITH DIFFERENTIAL/PLATELET
Abs Immature Granulocytes: 0.02 10*3/uL (ref 0.00–0.07)
Basophils Absolute: 0.1 10*3/uL (ref 0.0–0.1)
Basophils Relative: 1 %
Eosinophils Absolute: 0.3 10*3/uL (ref 0.0–0.5)
Eosinophils Relative: 4 %
HCT: 43.9 % (ref 36.0–46.0)
Hemoglobin: 14.7 g/dL (ref 12.0–15.0)
Immature Granulocytes: 0 %
Lymphocytes Relative: 44 %
Lymphs Abs: 3.1 10*3/uL (ref 0.7–4.0)
MCH: 29.1 pg (ref 26.0–34.0)
MCHC: 33.5 g/dL (ref 30.0–36.0)
MCV: 86.8 fL (ref 80.0–100.0)
Monocytes Absolute: 0.5 10*3/uL (ref 0.1–1.0)
Monocytes Relative: 8 %
Neutro Abs: 3 10*3/uL (ref 1.7–7.7)
Neutrophils Relative %: 43 %
Platelets: 264 10*3/uL (ref 150–400)
RBC: 5.06 MIL/uL (ref 3.87–5.11)
RDW: 12.5 % (ref 11.5–15.5)
WBC: 7 10*3/uL (ref 4.0–10.5)
nRBC: 0 % (ref 0.0–0.2)

## 2022-07-27 LAB — COMPREHENSIVE METABOLIC PANEL
ALT: 39 U/L (ref 0–44)
AST: 35 U/L (ref 15–41)
Albumin: 4 g/dL (ref 3.5–5.0)
Alkaline Phosphatase: 99 U/L (ref 38–126)
Anion gap: 12 (ref 5–15)
BUN: 14 mg/dL (ref 6–20)
CO2: 24 mmol/L (ref 22–32)
Calcium: 9.6 mg/dL (ref 8.9–10.3)
Chloride: 104 mmol/L (ref 98–111)
Creatinine, Ser: 0.65 mg/dL (ref 0.44–1.00)
GFR, Estimated: >60 mL/min (ref >60)
Glucose, Bld: 113 mg/dL — ABNORMAL HIGH (ref 70–99)
Potassium: 4.6 mmol/L (ref 3.5–5.1)
Sodium: 140 mmol/L (ref 135–145)
Total Bilirubin: 0.6 mg/dL (ref 0.3–1.2)
Total Protein: 8.2 g/dL — ABNORMAL HIGH (ref 6.5–8.1)

## 2022-07-27 LAB — TROPONIN I (HIGH SENSITIVITY): Troponin I (High Sensitivity): 2 ng/L (ref <18)

## 2022-07-27 MED ORDER — ACETAMINOPHEN 325 MG PO TABS
650.0000 mg | ORAL_TABLET | Freq: Once | ORAL | Status: AC
  Administered 2022-07-27: 650 mg via ORAL
  Filled 2022-07-27: qty 2

## 2022-07-27 NOTE — ED Provider Notes (Signed)
[facility] EMERGENCY DEPARTMENT Provider Note   CSN: [id number] Arrival date & time: 07/27/22  1046     History  No chief complaint on file.   [name] is a 51 y.o. female.  Patient is a 51 year old female with a past medical history of hypertension and hyperlipidemia presenting to the emergency department with chest pain.  The patient states that last night while cleaning the house she developed a sharp left-sided sternal chest pain.  She states that it felt like a stabbing type of pain and lasted for a few seconds.  She states it was associated with shortness of breath.  She states that since then the pain has been recurring every few hours and still lasts her just a few seconds at a time.  She states that will happen even when she is at rest.  She states that she occasionally will feel a burning pain from her epigastrium to her chest.  She denies any fevers or chills, cough, nausea or vomiting.  She denies any recent spicy or fried foods.  She denies any lower extremity swelling, history of blood clots, recent hospitalizations or surgeries, recent long travels in the car plane, or hormone use.  States that she has also been under increased stress with her child who is in high school.  The history is provided by the patient and the spouse. A language interpreter was used (#[id number]).       Home Medications Prior to Admission medications   Medication Sig Start Date End Date Taking? Authorizing Provider  amLODipine (NORVASC) 5 MG tablet TAKE 1 TABLET (5 MG TOTAL) BY MOUTH DAILY. 10/05/21 10/05/22  Claiborne Rigg, NP  atorvastatin (LIPITOR) 20 MG tablet Take 1 tablet (20 mg total) by mouth daily. 10/05/21   Claiborne Rigg, NP  cyclobenzaprine (FLEXERIL) 10 MG tablet Take 1 tablet (10 mg total) by mouth 3 (three) times daily as needed for muscle spasms. 10/05/21   Claiborne Rigg, NP  hydrOXYzine (VISTARIL) 25 MG capsule Take 1 capsule (25 mg  total) by mouth 3 (three) times daily as needed. 12/07/21   Claiborne Rigg, NP  ibuprofen (ADVIL) 600 MG tablet Take 1 tablet (600 mg total) by mouth every 8 (eight) hours as needed. 12/12/19   Claiborne Rigg, NP  mometasone (ELOCON) 0.1 % cream Apply 1 application topically daily. 12/07/21   Claiborne Rigg, NP  ondansetron (ZOFRAN) 4 MG tablet Take 1 tablet (4 mg total) by mouth every 8 (eight) hours as needed for nausea or vomiting. 03/10/22   Fayrene Helper, PA-C      Allergies    Patient has no known allergies.    Review of Systems   Review of Systems  Physical Exam Updated Vital Signs BP 125/79   Pulse 73   Temp 98.2 F (36.8 C) (Oral)   Resp (!) 8   LMP 11/08/2016   SpO2 97%  Physical Exam Vitals and nursing note reviewed.  Constitutional:      General: She is not in acute distress.    Appearance: Normal appearance. She is obese.  HENT:     Head: Normocephalic and atraumatic.     Nose: Nose normal.     Mouth/Throat:     Mouth: Mucous membranes are moist.     Pharynx: Oropharynx is clear.  Eyes:     Extraocular Movements: Extraocular movements intact.     Conjunctiva/sclera: Conjunctivae normal.  Cardiovascular:     Rate and Rhythm: Normal  rate and regular rhythm.     Pulses: Normal pulses.     Heart sounds: Normal heart sounds.     Comments: +L-sided midsternal point chest wall tenderness, no overlying skin changes Pulmonary:     Effort: Pulmonary effort is normal.     Breath sounds: Normal breath sounds.  Abdominal:     General: Abdomen is flat.     Palpations: Abdomen is soft.     Tenderness: There is no abdominal tenderness.  Musculoskeletal:        General: Normal range of motion.     Cervical back: Normal range of motion and neck supple.     Right lower leg: No edema.     Left lower leg: No edema.  Skin:    General: Skin is dry.  Neurological:     General: No focal deficit present.     Mental Status: She is alert and oriented to person, place, and time.   Psychiatric:        Behavior: Behavior normal.     ED Results / Procedures / Treatments   Labs (all labs ordered are listed, but only abnormal results are displayed) Labs Reviewed  COMPREHENSIVE METABOLIC PANEL - Abnormal; Notable for the following components:      Result Value   Glucose, Bld 113 (*)    Total Protein 8.2 (*)    All other components within normal limits  CBC WITH DIFFERENTIAL/PLATELET  TROPONIN I (HIGH SENSITIVITY)    EKG EKG Interpretation  Date/Time:  Tuesday July 27 2022 10:51:08 EDT Ventricular Rate:  85 PR Interval:  156 QRS Duration: 88 QT Interval:  396 QTC Calculation: 471 R Axis:   43 Text Interpretation: Normal sinus rhythm Normal ECG No significant change since last tracing Confirmed by Oneal Deputy [phone]) on 07/27/2022 1:41:43 PM  Radiology DG Chest 2 View  Result Date: 07/27/2022 CLINICAL DATA:  Chest pain EXAM: CHEST - 2 VIEW COMPARISON:  11/07/2006 FINDINGS: Cardiac and mediastinal contours are within normal limits. No focal pulmonary opacity. No pleural effusion or pneumothorax. No acute osseous abnormality. IMPRESSION: No acute cardiopulmonary process. Electronically Signed   By: Merilyn Baba M.D.   On: 07/27/2022 12:06    Procedures Procedures    Medications Ordered in ED Medications  acetaminophen (TYLENOL) tablet 650 mg (650 mg Oral Given 07/27/22 1422)    ED Course/ Medical Decision Making/ A&P                           Medical Decision Making This patient presents to the ED with chief complaint(s) of chest pain with pertinent past medical history of HTN, HLD which further complicates the presenting complaint. The complaint involves an extensive differential diagnosis and also carries with it a high risk of complications and morbidity.    The differential diagnosis includes ACS, arrhythmia, anemia, GERD, pneumothorax, pneumonia, pulmonary edema, musculoskeletal pain, anxiety  Additional history obtained: Additional  history obtained from family Records reviewed previous ED records  ED Course and Reassessment: Patient was initially evaluated by provider in triage and had labs, EKG and chest x-ray performed to evaluate cause of her chest pain.  Her work-up is within normal range including a normal EKG and normal troponin.  She does have reproducible chest pain on exam concerning for possible musculoskeletal pain and will be given Tylenol for pain.  She is low risk for ACS with a heart score of 2 for her age and risk factors.  She  is stable for discharge with outpatient primary care follow-up.  She was given strict return precautions.  Independent labs interpretation:  The following labs were independently interpreted: Within normal range  Independent visualization of imaging: - I independently visualized the following imaging with scope of interpretation limited to determining acute life threatening conditions related to emergency care: Chest x-ray, which revealed no acute disease  Consultation: - Consulted or discussed management/test interpretation w/ external professional: N/A  Consideration for admission or further workup: Patient has no emergent conditions requiring admission at this time and is stable for discharge home with primary care follow-up Social Determinants of health: N/A            Final Clinical Impression(s) / ED Diagnoses Final diagnoses:  Chest pain, unspecified type    Rx / DC Orders ED Discharge Orders     None         Phoebe Sharps, DO 07/27/22 1422

## 2022-07-27 NOTE — ED Provider Triage Note (Signed)
Emergency Medicine Provider Triage Evaluation Note  [name] , a 51 y.o. female  was evaluated in triage.  Pt complains of chest pain.  Patient states that symptoms began yesterday.  She describes it as left-sided pinching pain that radiates into her left neck.  Has had associated diaphoresis and shortness of breath.  She denies palpitations or nausea.  States that the pain has been intermittent since yesterday..  Review of Systems  Positive: See above Negative:   Physical Exam  BP 136/89 (BP Location: Right Arm)   Pulse 82   Temp 99 F (37.2 C) (Oral)   Resp 16   LMP 11/08/2016   SpO2 96%  Gen:   Awake, no distress   Resp:  Normal effort  MSK:   Moves extremities without difficulty  Other:  S1/S2 without murmur.  Medical Decision Making  Medically screening exam initiated at 11:09 AM.  Appropriate orders placed.  [name] was informed that the remainder of the evaluation will be completed by another provider, this initial triage assessment does not replace that evaluation, and the importance of remaining in the ED until their evaluation is complete.     Cheryl Hillier, PA-C 07/27/22 1110

## 2022-07-27 NOTE — Telephone Encounter (Signed)
Used Spanish interpreter Elissa Lovett # [phone].  Chief Complaint: Left side chest pain, SOB, sweating Symptoms: Above Frequency: Yesterday Pertinent Negatives: Patient denies  Disposition: [x] ED /[] Urgent Care (no appt availability in office) / [] Appointment(In office/virtual)/ []  [facility]/ [] Home Care/ [] Refused Recommended Disposition /[] [facility]/ []  Follow-up with PCP Additional Notes: No severe pain now, but with other symptoms , sent to ED.  Reason for Disposition  Pain also in shoulder(s) or arm(s) or jaw  (Exception: Pain is clearly made worse by movement.)  Answer Assessment - Initial Assessment Questions 1. LOCATION: "Where does it hurt?"       On left breast 2. RADIATION: "Does the pain go anywhere else?" (e.g., into neck, jaw, arms, back)     Neck 3. ONSET: "When did the chest pain begin?" (Minutes, hours or days)      Yesterday 4. PATTERN: "Does the pain come and go, or has it been constant since it started?"  "Does it get worse with exertion?"      Comes and goes  5. DURATION: "How long does it last" (e.g., seconds, minutes, hours)      6. SEVERITY: "How bad is the pain?"  (e.g., Scale 1-10; mild, moderate, or severe)    - MILD (1-3): doesn't interfere with normal activities     - MODERATE (4-7): interferes with normal activities or awakens from sleep    - SEVERE (8-10): excruciating pain, unable to do any normal activities       Now - 2 7. CARDIAC RISK FACTORS: "Do you have any history of heart problems or risk factors for heart disease?" (e.g., angina, prior heart attack; diabetes, high blood pressure, high cholesterol, smoker, or strong family history of heart disease)     HTN 8. PULMONARY RISK FACTORS: "Do you have any history of lung disease?"  (e.g., blood clots in lung, asthma, emphysema, birth control pills)     No 9. CAUSE: "What do you think is causing the chest pain?"     Unsure 10. OTHER SYMPTOMS: "Do you have any other  symptoms?" (e.g., dizziness, nausea, vomiting, sweating, fever, difficulty breathing, cough)       SOB, sweating 11. PREGNANCY: "Is there any chance you are pregnant?" "When was your last menstrual period?"       No  Protocols used: Chest Pain-A-AH

## 2022-07-27 NOTE — Discharge Instructions (Addendum)
You were seen in the emergency department for your chest pain.  Your work-up showed no signs of heart attack, abnormal heart rhythms, pneumonia or fluid on your lungs.  It is unclear exactly what is the cause of your chest pain.  You may have a strained muscle in your chest and you can take Tylenol for pain.  It may also be related to reflux or stress.  You should follow-up with your primary doctor to have your symptoms rechecked.  You should return to the emergency department if you have significantly worsening chest pain that does not go away, worsening shortness of breath, you pass out, or if you have any other new or concerning symptoms.

## 2022-08-18 ENCOUNTER — Ambulatory Visit: Payer: No Typology Code available for payment source | Admitting: Nurse Practitioner

## 2022-08-20 ENCOUNTER — Other Ambulatory Visit: Payer: Self-pay | Admitting: Pharmacist

## 2022-08-20 ENCOUNTER — Other Ambulatory Visit: Payer: Self-pay

## 2022-08-20 DIAGNOSIS — I10 Essential (primary) hypertension: Secondary | ICD-10-CM

## 2022-08-20 MED ORDER — AMLODIPINE BESYLATE 5 MG PO TABS
ORAL_TABLET | Freq: Every day | ORAL | 0 refills | Status: DC
  Filled 2022-08-20: qty 30, 30d supply, fill #0

## 2022-09-22 ENCOUNTER — Ambulatory Visit: Payer: No Typology Code available for payment source | Admitting: Physician Assistant

## 2022-09-29 ENCOUNTER — Encounter: Payer: Self-pay | Admitting: Nurse Practitioner

## 2022-09-29 ENCOUNTER — Ambulatory Visit: Payer: Self-pay | Attending: Nurse Practitioner | Admitting: Nurse Practitioner

## 2022-09-29 ENCOUNTER — Other Ambulatory Visit: Payer: Self-pay

## 2022-09-29 DIAGNOSIS — I10 Essential (primary) hypertension: Secondary | ICD-10-CM

## 2022-09-29 DIAGNOSIS — L400 Psoriasis vulgaris: Secondary | ICD-10-CM

## 2022-09-29 MED ORDER — HYDROXYZINE PAMOATE 25 MG PO CAPS
25.0000 mg | ORAL_CAPSULE | Freq: Three times a day (TID) | ORAL | 3 refills | Status: DC | PRN
  Filled 2022-09-29: qty 60, 20d supply, fill #0

## 2022-09-29 MED ORDER — AMLODIPINE BESYLATE 5 MG PO TABS
5.0000 mg | ORAL_TABLET | Freq: Every day | ORAL | 1 refills | Status: DC
  Filled 2022-09-29: qty 90, 90d supply, fill #0

## 2022-09-29 NOTE — Progress Notes (Signed)
Assessment & Plan:  Rema was seen today for hypertension.  Diagnoses and all orders for this visit:  Essential hypertension -     amLODipine (NORVASC) 5 MG tablet; Take 1 tablet (5 mg total) by mouth daily. Continue all antihypertensives as prescribed.  Reminded to bring in blood pressure log for follow  up appointment.  RECOMMENDATIONS: DASH/Mediterranean Diets are healthier choices for HTN.    Psoriasis vulgaris -     hydrOXYzine (VISTARIL) 25 MG capsule; Take 1 capsule (25 mg total) by mouth 3 (three) times daily as needed.    Patient has been counseled on age-appropriate routine health concerns for screening and prevention. These are reviewed and up-to-date. Referrals have been placed accordingly. Immunizations are up-to-date or declined.    Subjective:   Chief Complaint  Patient presents with   Hypertension   Hypertension Pertinent negatives include no blurred vision, chest pain, headaches, malaise/fatigue, palpitations or shortness of breath.   [name] 51 y.o. female presents to office today for follow up to HTN   HTN Blood pressure is well controlled with amlodipine 5 mg daily.  BP Readings from Last 3 Encounters:  09/29/22 123/81  07/27/22 125/79  03/10/22 (!) 126/101     Review of Systems  Constitutional:  Negative for fever, malaise/fatigue and weight loss.  HENT: Negative.  Negative for nosebleeds.   Eyes: Negative.  Negative for blurred vision, double vision and photophobia.  Respiratory: Negative.  Negative for cough and shortness of breath.   Cardiovascular: Negative.  Negative for chest pain, palpitations and leg swelling.  Gastrointestinal: Negative.  Negative for heartburn, nausea and vomiting.  Musculoskeletal: Negative.  Negative for myalgias.  Neurological: Negative.  Negative for dizziness, focal weakness, seizures and headaches.  Psychiatric/Behavioral: Negative.  Negative for suicidal ideas.     Past Medical History:   Diagnosis Date   GERD (gastroesophageal reflux disease)    Hypertension    2011   Inverted nipple    bilateral    Past Surgical History:  Procedure Laterality Date   CESAREAN SECTION     2011   ECTOPIC PREGNANCY SURGERY  1994    TUBAL LIGATION     2011    Family History  Problem Relation Age of Onset   Diabetes Mother    Hypertension Mother    Cancer Neg Hx    Heart disease Neg Hx    Kidney disease Neg Hx    Depression Neg Hx    Breast cancer Neg Hx     Social History Reviewed with no changes to be made today.   Outpatient Medications Prior to Visit  Medication Sig Dispense Refill   atorvastatin (LIPITOR) 20 MG tablet Take 1 tablet (20 mg total) by mouth daily. 90 tablet 3   ibuprofen (ADVIL) 600 MG tablet Take 1 tablet (600 mg total) by mouth every 8 (eight) hours as needed. 60 tablet 1   mometasone (ELOCON) 0.1 % cream Apply 1 application topically daily. 60 g 1   amLODipine (NORVASC) 5 MG tablet TAKE 1 TABLET (5 MG TOTAL) BY MOUTH DAILY. 30 tablet 0   hydrOXYzine (VISTARIL) 25 MG capsule Take 1 capsule (25 mg total) by mouth 3 (three) times daily as needed. 60 capsule 3   cyclobenzaprine (FLEXERIL) 10 MG tablet Take 1 tablet (10 mg total) by mouth 3 (three) times daily as needed for muscle spasms. (Patient not taking: Reported on 09/29/2022) 30 tablet 2   ondansetron (ZOFRAN) 4 MG tablet Take 1 tablet (4 mg  total) by mouth every 8 (eight) hours as needed for nausea or vomiting. (Patient not taking: Reported on 09/29/2022) 12 tablet 0   No facility-administered medications prior to visit.    No Known Allergies     Objective:    BP 123/81   Pulse 93   Temp 98.1 F (36.7 C) (Temporal)   Ht 5\' 1"  (1.549 m)   Wt 181 lb 3.2 oz (82.2 kg)   LMP 11/08/2016   SpO2 100%   BMI 34.24 kg/m  Wt Readings from Last 3 Encounters:  09/29/22 181 lb 3.2 oz (82.2 kg)  03/10/22 188 lb (85.3 kg)  12/07/21 188 lb 6 oz (85.4 kg)    Physical Exam Vitals and nursing note  reviewed.  Constitutional:      Appearance: She is well-developed.  HENT:     Head: Normocephalic and atraumatic.  Cardiovascular:     Rate and Rhythm: Normal rate and regular rhythm.     Heart sounds: Normal heart sounds. No murmur heard.    No friction rub. No gallop.  Pulmonary:     Effort: Pulmonary effort is normal. No tachypnea or respiratory distress.     Breath sounds: Normal breath sounds. No decreased breath sounds, wheezing, rhonchi or rales.  Chest:     Chest wall: No tenderness.  Abdominal:     General: Bowel sounds are normal.     Palpations: Abdomen is soft.  Musculoskeletal:        General: Normal range of motion.     Cervical back: Normal range of motion.  Skin:    General: Skin is warm and dry.  Neurological:     Mental Status: She is alert and oriented to person, place, and time.     Coordination: Coordination normal.  Psychiatric:        Behavior: Behavior normal. Behavior is cooperative.        Thought Content: Thought content normal.        Judgment: Judgment normal.          Patient has been counseled extensively about nutrition and exercise as well as the importance of adherence with medications and regular follow-up. The patient was given clear instructions to go to ER or return to medical center if symptoms don't improve, worsen or new problems develop. The patient verbalized understanding.   Follow-up: Return in about 3 months (around 12/30/2022).   Gildardo Pounds, FNP-BC [facility] and [place], [place]   09/29/2022, 2:56 PM

## 2022-10-14 ENCOUNTER — Other Ambulatory Visit: Payer: Self-pay

## 2022-12-30 ENCOUNTER — Other Ambulatory Visit: Payer: Self-pay

## 2022-12-30 ENCOUNTER — Encounter: Payer: Self-pay | Admitting: Nurse Practitioner

## 2022-12-30 ENCOUNTER — Ambulatory Visit: Payer: Self-pay | Attending: Nurse Practitioner | Admitting: Nurse Practitioner

## 2022-12-30 VITALS — BP 120/82 | HR 106 | Temp 98.4°F | Resp 20 | Wt 184.0 lb

## 2022-12-30 DIAGNOSIS — Z1231 Encounter for screening mammogram for malignant neoplasm of breast: Secondary | ICD-10-CM

## 2022-12-30 DIAGNOSIS — K219 Gastro-esophageal reflux disease without esophagitis: Secondary | ICD-10-CM

## 2022-12-30 DIAGNOSIS — Z23 Encounter for immunization: Secondary | ICD-10-CM

## 2022-12-30 DIAGNOSIS — R7303 Prediabetes: Secondary | ICD-10-CM

## 2022-12-30 DIAGNOSIS — Z1211 Encounter for screening for malignant neoplasm of colon: Secondary | ICD-10-CM

## 2022-12-30 DIAGNOSIS — I10 Essential (primary) hypertension: Secondary | ICD-10-CM

## 2022-12-30 MED ORDER — AMLODIPINE BESYLATE 5 MG PO TABS
5.0000 mg | ORAL_TABLET | Freq: Every day | ORAL | 1 refills | Status: DC
  Filled 2022-12-30: qty 90, 90d supply, fill #0

## 2022-12-30 MED ORDER — ZOSTER VAC RECOMB ADJUVANTED 50 MCG/0.5ML IM SUSR
0.5000 mL | Freq: Once | INTRAMUSCULAR | 1 refills | Status: DC
  Filled 2022-12-30: qty 1, 2d supply, fill #0

## 2022-12-30 MED ORDER — ZOSTER VAC RECOMB ADJUVANTED 50 MCG/0.5ML IM SUSR: 0.5000 mL | Freq: Once | INTRAMUSCULAR | 0 refills | Status: AC

## 2022-12-30 MED ORDER — FAMOTIDINE 40 MG PO TABS
40.0000 mg | ORAL_TABLET | Freq: Every day | ORAL | 3 refills | Status: DC
  Filled 2022-12-30: qty 90, 90d supply, fill #0

## 2022-12-30 NOTE — Addendum Note (Signed)
Addended by: Carilyn Goodpasture on: 12/30/2022 04:30 PM   Modules accepted: Orders

## 2022-12-30 NOTE — Addendum Note (Signed)
Addended by: Carilyn Goodpasture on: 12/30/2022 04:45 PM   Modules accepted: Orders

## 2022-12-30 NOTE — Progress Notes (Signed)
Assessment & Plan:  Monicia was seen today for hypertension.  Diagnoses and all orders for this visit:  Essential hypertension Well controlled -     amLODipine (NORVASC) 5 MG tablet; Take 1 tablet (5 mg total) by mouth daily. -     CMP14+EGFR  GERD without esophagitis -     famotidine (PEPCID) 40 MG tablet; Take 1 tablet (40 mg total) by mouth daily. INSTRUCTIONS: Avoid GERD Triggers: acidic, spicy or fried foods, caffeine, coffee, sodas,  alcohol and chocolate.    Prediabetes -     Hemoglobin A1c  Colon cancer screening -     Fecal occult blood, imunochemical([facility])  Breast cancer screening by mammogram -     MS DIGITAL SCREENING TOMO BILATERAL; Future    Patient has been counseled on age-appropriate routine health concerns for screening and prevention. These are reviewed and up-to-date. Referrals have been placed accordingly. Immunizations are up-to-date or declined.    Subjective:   Chief Complaint  Patient presents with   Hypertension   Hypertension Pertinent negatives include no blurred vision, chest pain, headaches, malaise/fatigue, palpitations or shortness of breath.   [name] 52 y.o. female presents to office today for follow up to HTN  VRI was used to communicate directly with patient for the entire encounter including providing detailed patient instructions.    GERD Notes throat burning and abdominal pain when she drinks any liquids and with any food. Pain described as burning in stomach. Aggravating factors: stress, spicy foods. Denies melena, hematochezia, nausea or vomiting.   HTN Blood pressure is well controlled. She is taking amlodipine 5 mg daily.  BP Readings from Last 3 Encounters:  12/30/22 120/82  09/29/22 123/81  07/27/22 125/79     Review of Systems  Constitutional:  Negative for fever, malaise/fatigue and weight loss.  HENT: Negative.  Negative for nosebleeds.   Eyes: Negative.  Negative for blurred  vision, double vision and photophobia.  Respiratory: Negative.  Negative for cough and shortness of breath.   Cardiovascular: Negative.  Negative for chest pain, palpitations and leg swelling.  Gastrointestinal:  Positive for abdominal pain and heartburn. Negative for blood in stool, constipation, diarrhea, melena, nausea and vomiting.  Musculoskeletal: Negative.  Negative for myalgias.  Neurological: Negative.  Negative for dizziness, focal weakness, seizures and headaches.  Psychiatric/Behavioral: Negative.  Negative for suicidal ideas.     Past Medical History:  Diagnosis Date   GERD (gastroesophageal reflux disease)    Hypertension    2011   Inverted nipple    bilateral    Past Surgical History:  Procedure Laterality Date   CESAREAN SECTION     2011   ECTOPIC PREGNANCY SURGERY  1994    TUBAL LIGATION     2011    Family History  Problem Relation Age of Onset   Diabetes Mother    Hypertension Mother    Cancer Neg Hx    Heart disease Neg Hx    Kidney disease Neg Hx    Depression Neg Hx    Breast cancer Neg Hx     Social History Reviewed with no changes to be made today.   Outpatient Medications Prior to Visit  Medication Sig Dispense Refill   atorvastatin (LIPITOR) 20 MG tablet Take 1 tablet (20 mg total) by mouth daily. 90 tablet 3   hydrOXYzine (VISTARIL) 25 MG capsule Take 1 capsule (25 mg total) by mouth 3 (three) times daily as needed for itching. FOR ITCHING 60 capsule 3  ibuprofen (ADVIL) 600 MG tablet Take 1 tablet (600 mg total) by mouth every 8 (eight) hours as needed. 60 tablet 1   mometasone (ELOCON) 0.1 % cream Apply 1 application topically daily. 60 g 1   amLODipine (NORVASC) 5 MG tablet Take 1 tablet (5 mg total) by mouth daily. 90 tablet 1   No facility-administered medications prior to visit.    No Known Allergies     Objective:    BP 120/82   Pulse (!) 106   Temp 98.4 F (36.9 C) (Oral)   Resp 20   Wt 184 lb (83.5 kg)   LMP 11/08/2016    SpO2 98%   BMI 34.77 kg/m  Wt Readings from Last 3 Encounters:  12/30/22 184 lb (83.5 kg)  09/29/22 181 lb 3.2 oz (82.2 kg)  03/10/22 188 lb (85.3 kg)    Physical Exam Vitals and nursing note reviewed.  Constitutional:      Appearance: She is well-developed.  HENT:     Head: Normocephalic and atraumatic.  Cardiovascular:     Rate and Rhythm: Normal rate and regular rhythm.     Heart sounds: Normal heart sounds. No murmur heard.    No friction rub. No gallop.  Pulmonary:     Effort: Pulmonary effort is normal. No tachypnea or respiratory distress.     Breath sounds: Normal breath sounds. No decreased breath sounds, wheezing, rhonchi or rales.  Chest:     Chest wall: No tenderness.  Abdominal:     General: Bowel sounds are normal.     Palpations: Abdomen is soft.  Musculoskeletal:        General: Normal range of motion.     Cervical back: Normal range of motion.  Skin:    General: Skin is warm and dry.  Neurological:     Mental Status: She is alert and oriented to person, place, and time.     Coordination: Coordination normal.  Psychiatric:        Behavior: Behavior normal. Behavior is cooperative.        Thought Content: Thought content normal.        Judgment: Judgment normal.          Patient has been counseled extensively about nutrition and exercise as well as the importance of adherence with medications and regular follow-up. The patient was given clear instructions to go to ER or return to medical center if symptoms don't improve, worsen or new problems develop. The patient verbalized understanding.   Follow-up: Return in about 4 weeks (around 01/27/2023) for GERD.   Gildardo Pounds, FNP-BC [facility] and [facility], [place]   12/30/2022, 4:13 PM

## 2022-12-31 ENCOUNTER — Other Ambulatory Visit: Payer: Self-pay

## 2022-12-31 ENCOUNTER — Other Ambulatory Visit: Payer: Self-pay | Admitting: Obstetrics and Gynecology

## 2022-12-31 DIAGNOSIS — Z1231 Encounter for screening mammogram for malignant neoplasm of breast: Secondary | ICD-10-CM

## 2023-01-03 ENCOUNTER — Ambulatory Visit: Payer: Self-pay | Attending: Nurse Practitioner

## 2023-01-04 LAB — CMP14+EGFR
ALT: 29 IU/L (ref 0–32)
AST: 25 IU/L (ref 0–40)
Albumin/Globulin Ratio: 1.3 (ref 1.2–2.2)
Albumin: 4.4 g/dL (ref 3.8–4.9)
Alkaline Phosphatase: 133 IU/L — ABNORMAL HIGH (ref 44–121)
BUN/Creatinine Ratio: 17 (ref 9–23)
BUN: 11 mg/dL (ref 6–24)
Bilirubin Total: 0.3 mg/dL (ref 0.0–1.2)
CO2: 23 mmol/L (ref 20–29)
Calcium: 9.7 mg/dL (ref 8.7–10.2)
Chloride: 103 mmol/L (ref 96–106)
Creatinine, Ser: 0.66 mg/dL (ref 0.57–1.00)
Globulin, Total: 3.3 g/dL (ref 1.5–4.5)
Glucose: 120 mg/dL — ABNORMAL HIGH (ref 70–99)
Potassium: 5.1 mmol/L (ref 3.5–5.2)
Sodium: 139 mmol/L (ref 134–144)
Total Protein: 7.7 g/dL (ref 6.0–8.5)
eGFR: 106 mL/min/{1.73_m2} (ref >59)

## 2023-01-04 LAB — HEMOGLOBIN A1C
Est. average glucose Bld gHb Est-mCnc: 137 mg/dL
Hgb A1c MFr Bld: 6.4 % — ABNORMAL HIGH (ref 4.8–5.6)

## 2023-02-03 ENCOUNTER — Ambulatory Visit
Admission: RE | Admit: 2023-02-03 | Discharge: 2023-02-03 | Disposition: A | Discharge status: Home / Self Care | Payer: No Typology Code available for payment source | Source: Ambulatory Visit | Attending: Obstetrics and Gynecology | Admitting: Obstetrics and Gynecology

## 2023-02-03 ENCOUNTER — Telehealth: Payer: Self-pay | Admitting: Nurse Practitioner

## 2023-02-03 ENCOUNTER — Ambulatory Visit: Payer: Self-pay | Admitting: Hematology and Oncology

## 2023-02-03 VITALS — BP 122/70 | Wt 183.0 lb

## 2023-02-03 DIAGNOSIS — Z1231 Encounter for screening mammogram for malignant neoplasm of breast: Secondary | ICD-10-CM

## 2023-02-03 NOTE — Progress Notes (Signed)
Ms. [name] is a 52 y.o. female who presents to [facility] clinic today with no complaints.    Pap Smear: Pap not smear completed today. Last Pap smear was 2023 and was normal. Per patient has no history of an abnormal Pap smear. Last Pap smear result is available in Epic.   Physical exam: Breasts Breasts symmetrical. No skin abnormalities bilateral breasts. No nipple retraction bilateral breasts. No nipple discharge bilateral breasts. No lymphadenopathy. No lumps palpated bilateral breasts.    MS DIGITAL SCREENING TOMO BILATERAL  Result Date: 12/18/2021 CLINICAL DATA:  Screening. EXAM: DIGITAL SCREENING BILATERAL MAMMOGRAM WITH TOMOSYNTHESIS AND CAD TECHNIQUE: Bilateral screening digital craniocaudal and mediolateral oblique mammograms were obtained. Bilateral screening digital breast tomosynthesis was performed. The images were evaluated with computer-aided detection. COMPARISON:  None. ACR Breast Density Category b: There are scattered areas of fibroglandular density. FINDINGS: There are no findings suspicious for malignancy. IMPRESSION: No mammographic evidence of malignancy. A result letter of this screening mammogram will be mailed directly to the patient. RECOMMENDATION: Screening mammogram in one year. (Code:SM-B-01Y) BI-RADS CATEGORY  1: Negative. Electronically Signed   By: Nolon Nations M.D.   On: 12/18/2021 10:13   MS DIGITAL SCREENING TOMO BILATERAL  Result Date: 04/13/2019 CLINICAL DATA:  Screening. EXAM: DIGITAL SCREENING BILATERAL MAMMOGRAM WITH TOMO AND CAD COMPARISON:  Previous exam(s). ACR Breast Density Category c: The breast tissue is heterogeneously dense, which may obscure small masses. FINDINGS: There are no findings suspicious for malignancy. Images were processed with CAD. IMPRESSION: No mammographic evidence of malignancy. A result letter of this screening mammogram will be mailed directly to the patient. RECOMMENDATION: Screening mammogram in one year.  (Code:SM-B-01Y) BI-RADS CATEGORY  1: Negative. Electronically Signed   By: Lovey Newcomer M.D.   On: 04/13/2019 08:04       Pelvic/Bimanual Pap is not indicated today    Smoking History: Patient has never smoked and was not referred to quit line.    Patient Navigation: Patient education provided. Access to services provided for patient through [place] interpreter provided. No transportation provided   Colorectal Cancer Screening: Per patient has never had colonoscopy completed No complaints today. FIT test given per Dr. Raul Del   Breast and Cervical Cancer Risk Assessment: Patient does not have family history of breast cancer, known genetic mutations, or radiation treatment to the chest before age 75. Patient does not have history of cervical dysplasia, immunocompromised, or DES exposure in-utero.  Risk Scores as of 02/03/2023     [name]           5-year 0.66 %   Lifetime 5.86 %   This patient is [place] but has no documented birth country, so the [facility] used data from [place] patients to calculate their risk score. Document a birth country in the Demographics activity for a more accurate score.         Last calculated by Claretha Cooper, CMA on 02/03/2023 at  9:27 AM        A: [facility] exam without pap smear No complaints with benign exam.   P: Referred patient to the [place] for a screening mammogram. Appointment scheduled 02/03/23.  Melodye Ped, NP 02/03/2023 9:41 AM

## 2023-02-03 NOTE — Telephone Encounter (Signed)
Copied from [place] [phone]. Topic: General - Inquiry >> Feb 03, 2023  2:06 PM Marcellus Scott wrote: Reason for CRM: Pt stated this morning that she had her mammogram done, and the Dr. asked if she had done her colon cancer screening. Pt stated she was advised she was supposed to receive a kit to do this. But has not heard back from anyone about this.  Pt is requesting a callback and would like clarification regarding colon cancer screening, whether she has to go somewhere or if she just hasn't received the kit.  Please advise.

## 2023-02-03 NOTE — Patient Instructions (Signed)
Taught [name] about self breast awareness and gave educational materials to take home. Patient did not need a Pap smear today due to last Pap smear was in 2023 per patient. Let her know [facility] will cover Pap smears every 5 years unless has a history of abnormal Pap smears. Referred patient to the [place] for screening mammogram. Appointment scheduled for 02/03/23. Patient aware of appointment and will be there. Let patient know will follow up with her within the next couple weeks with results. [name] verbalized understanding.  Melodye Ped, NP 9:42 AM

## 2023-02-08 NOTE — Telephone Encounter (Signed)
Patient will be given stool kit tomorrow at appointment.

## 2023-02-09 ENCOUNTER — Encounter: Payer: Self-pay | Admitting: Nurse Practitioner

## 2023-02-09 ENCOUNTER — Ambulatory Visit: Payer: Self-pay | Attending: Nurse Practitioner | Admitting: Nurse Practitioner

## 2023-02-09 ENCOUNTER — Other Ambulatory Visit: Payer: Self-pay

## 2023-02-09 VITALS — BP 138/78 | HR 88 | Ht 61.0 in | Wt 184.2 lb

## 2023-02-09 DIAGNOSIS — I10 Essential (primary) hypertension: Secondary | ICD-10-CM

## 2023-02-09 DIAGNOSIS — E785 Hyperlipidemia, unspecified: Secondary | ICD-10-CM

## 2023-02-09 DIAGNOSIS — L400 Psoriasis vulgaris: Secondary | ICD-10-CM

## 2023-02-09 DIAGNOSIS — Z23 Encounter for immunization: Secondary | ICD-10-CM

## 2023-02-09 DIAGNOSIS — M546 Pain in thoracic spine: Secondary | ICD-10-CM

## 2023-02-09 DIAGNOSIS — K219 Gastro-esophageal reflux disease without esophagitis: Secondary | ICD-10-CM

## 2023-02-09 MED ORDER — IBUPROFEN 600 MG PO TABS
600.0000 mg | ORAL_TABLET | Freq: Three times a day (TID) | ORAL | 1 refills | Status: DC | PRN
  Filled 2023-02-09: qty 60, 20d supply, fill #0
  Filled 2023-12-05 (×2): qty 60, 20d supply, fill #1

## 2023-02-09 MED ORDER — MOMETASONE FUROATE 0.1 % EX CREA
1.0000 | TOPICAL_CREAM | Freq: Every day | CUTANEOUS | 1 refills | Status: DC
  Filled 2023-02-09: qty 60, 25d supply, fill #0
  Filled 2023-09-02: qty 60, 25d supply, fill #1

## 2023-02-09 MED ORDER — HYDROXYZINE PAMOATE 25 MG PO CAPS
25.0000 mg | ORAL_CAPSULE | Freq: Three times a day (TID) | ORAL | 3 refills | Status: DC | PRN
  Filled 2023-02-09: qty 60, 20d supply, fill #0
  Filled 2023-12-05 (×2): qty 60, 20d supply, fill #1

## 2023-02-09 MED ORDER — FAMOTIDINE 40 MG PO TABS
40.0000 mg | ORAL_TABLET | Freq: Every day | ORAL | 3 refills | Status: DC
  Filled 2023-02-09 – 2023-03-31 (×3): qty 90, 90d supply, fill #0
  Filled 2023-09-02: qty 90, 90d supply, fill #1

## 2023-02-09 MED ORDER — AMLODIPINE BESYLATE 5 MG PO TABS
5.0000 mg | ORAL_TABLET | Freq: Every day | ORAL | 1 refills | Status: DC
  Filled 2023-02-09 – 2023-03-31 (×3): qty 90, 90d supply, fill #0
  Filled 2023-09-02: qty 90, 90d supply, fill #1

## 2023-02-09 MED ORDER — ATORVASTATIN CALCIUM 20 MG PO TABS
20.0000 mg | ORAL_TABLET | Freq: Every day | ORAL | 3 refills | Status: DC
  Filled 2023-02-09: qty 90, 90d supply, fill #0
  Filled 2023-09-02: qty 90, 90d supply, fill #1

## 2023-02-09 NOTE — Progress Notes (Signed)
Assessment & Plan:  [name] was seen today for gastroesophageal reflux.  Diagnoses and all orders for this visit:  GERD without esophagitis Well controlled -     famotidine (PEPCID) 40 MG tablet; Take 1 tablet (40 mg total) by mouth daily. INSTRUCTIONS: Avoid GERD Triggers: acidic, spicy or fried foods, caffeine, coffee, sodas,  alcohol and chocolate.    Essential hypertension Well controlled -     amLODipine (NORVASC) 5 MG tablet; Take 1 tablet (5 mg total) by mouth daily. Continue all antihypertensives as prescribed.  Reminded to bring in blood pressure log for follow  up appointment.  RECOMMENDATIONS: DASH/Mediterranean Diets are healthier choices for HTN. BP Readings from Last 3 Encounters:  02/09/23 138/78  02/03/23 122/70  12/30/22 120/82       Dyslipidemia, goal LDL below 100 -     atorvastatin (LIPITOR) 20 MG tablet; Take 1 tablet (20 mg total) by mouth daily. INSTRUCTIONS: Work on a low fat, heart healthy diet and participate in regular aerobic exercise program by working out at least 150 minutes per week; 5 days a week-30 minutes per day. Avoid red meat/beef/steak,  fried foods. junk foods, sodas, sugary drinks, unhealthy snacking, alcohol and smoking.  Drink at least 80 oz of water per day and monitor your carbohydrate intake daily.    Psoriasis vulgaris -     mometasone (ELOCON) 0.1 % cream; Apply 1 Application topically daily. -     hydrOXYzine (VISTARIL) 25 MG capsule; Take 1 capsule (25 mg total) by mouth 3 (three) times daily as needed for itching. FOR ITCHING  Acute midline thoracic back pain -     ibuprofen (ADVIL) 600 MG tablet; Take 1 tablet (600 mg total) by mouth every 8 (eight) hours as needed. Work on losing weight to help reduce back pain. May alternate with heat and ice application for pain relief. May also alternate with acetaminophen as prescribed for back pain. Other alternatives include massage, acupuncture and water aerobics.  You must stay active and  avoid a sedentary lifestyle.    Need for shingles vaccine -     Varicella-zoster vaccine IM    Patient has been counseled on age-appropriate routine health concerns for screening and prevention. These are reviewed and up-to-date. Referrals have been placed accordingly. Immunizations are up-to-date or declined.    Subjective:   Chief Complaint  Patient presents with   Gastroesophageal Reflux   Gastroesophageal Reflux She reports no chest pain, no coughing, no heartburn or no nausea. Pertinent negatives include no weight loss.   [name] 52 y.o. female presents to office today for follow up to GERD  VRI was used to communicate directly with patient for the entire encounter including providing detailed patient instructions.    She was started on famotidine 40 mg daily a few months ago for GERD symptoms. Today she reports significant improvement in GERD symptoms since starting Pepcid. At this time we will not make any changes with her famotidine.     Review of Systems  Constitutional:  Negative for fever, malaise/fatigue and weight loss.  HENT: Negative.  Negative for nosebleeds.   Eyes: Negative.  Negative for blurred vision, double vision and photophobia.  Respiratory: Negative.  Negative for cough and shortness of breath.   Cardiovascular: Negative.  Negative for chest pain, palpitations and leg swelling.  Gastrointestinal: Negative.  Negative for heartburn, nausea and vomiting.  Musculoskeletal: Negative.  Negative for myalgias.  Neurological: Negative.  Negative for dizziness, focal weakness, seizures and headaches.  Psychiatric/Behavioral: Negative.  Negative for suicidal ideas.     Past Medical History:  Diagnosis Date   GERD (gastroesophageal reflux disease)    Hypertension    2011   Inverted nipple    bilateral    Past Surgical History:  Procedure Laterality Date   CESAREAN SECTION     2011   ECTOPIC PREGNANCY SURGERY  1994    TUBAL  LIGATION     2011    Family History  Problem Relation Age of Onset   Diabetes Mother    Hypertension Mother    Cancer Neg Hx    Heart disease Neg Hx    Kidney disease Neg Hx    Depression Neg Hx    Breast cancer Neg Hx     Social History Reviewed with no changes to be made today.   Outpatient Medications Prior to Visit  Medication Sig Dispense Refill   amLODipine (NORVASC) 5 MG tablet Take 1 tablet (5 mg total) by mouth daily. 90 tablet 1   atorvastatin (LIPITOR) 20 MG tablet Take 1 tablet (20 mg total) by mouth daily. 90 tablet 3   famotidine (PEPCID) 40 MG tablet Take 1 tablet (40 mg total) by mouth daily. 90 tablet 3   hydrOXYzine (VISTARIL) 25 MG capsule Take 1 capsule (25 mg total) by mouth 3 (three) times daily as needed for itching. FOR ITCHING 60 capsule 3   ibuprofen (ADVIL) 600 MG tablet Take 1 tablet (600 mg total) by mouth every 8 (eight) hours as needed. 60 tablet 1   mometasone (ELOCON) 0.1 % cream Apply 1 application topically daily. 60 g 1   No facility-administered medications prior to visit.    No Known Allergies     Objective:    BP 138/78   Pulse 88   Ht 5\' 1"  (1.549 m)   Wt 184 lb 3.2 oz (83.6 kg)   LMP 11/08/2016   SpO2 98%   BMI 34.80 kg/m  Wt Readings from Last 3 Encounters:  02/09/23 184 lb 3.2 oz (83.6 kg)  02/03/23 183 lb (83 kg)  12/30/22 184 lb (83.5 kg)    Physical Exam Vitals and nursing note reviewed.  Constitutional:      Appearance: She is well-developed.  HENT:     Head: Normocephalic and atraumatic.  Cardiovascular:     Rate and Rhythm: Normal rate and regular rhythm.     Heart sounds: Normal heart sounds. No murmur heard.    No friction rub. No gallop.  Pulmonary:     Effort: Pulmonary effort is normal. No tachypnea or respiratory distress.     Breath sounds: Normal breath sounds. No decreased breath sounds, wheezing, rhonchi or rales.  Chest:     Chest wall: No tenderness.  Abdominal:     General: Bowel sounds are  normal.     Palpations: Abdomen is soft.  Musculoskeletal:        General: Normal range of motion.     Cervical back: Normal range of motion.  Skin:    General: Skin is warm and dry.  Neurological:     Mental Status: She is alert and oriented to person, place, and time.     Coordination: Coordination normal.  Psychiatric:        Behavior: Behavior normal. Behavior is cooperative.        Thought Content: Thought content normal.        Judgment: Judgment normal.          Patient has been  counseled extensively about nutrition and exercise as well as the importance of adherence with medications and regular follow-up. The patient was given clear instructions to go to ER or return to medical center if symptoms don't improve, worsen or new problems develop. The patient verbalized understanding.   Follow-up: Return in about 6 months (around 08/11/2023) for HTN, prediabetes.   Gildardo Pounds, FNP-BC [facility] and [facility] [place], [place]   02/09/2023, 4:59 PM

## 2023-02-14 ENCOUNTER — Other Ambulatory Visit: Payer: Self-pay

## 2023-02-16 LAB — FECAL OCCULT BLOOD, IMMUNOCHEMICAL: Fecal Occult Bld: NEGATIVE

## 2023-03-21 ENCOUNTER — Other Ambulatory Visit: Payer: Self-pay

## 2023-03-22 ENCOUNTER — Other Ambulatory Visit: Payer: Self-pay

## 2023-03-31 ENCOUNTER — Other Ambulatory Visit: Payer: Self-pay

## 2023-04-05 ENCOUNTER — Other Ambulatory Visit: Payer: Self-pay

## 2023-05-13 ENCOUNTER — Emergency Department (HOSPITAL_COMMUNITY): Payer: Self-pay

## 2023-05-13 ENCOUNTER — Other Ambulatory Visit: Payer: Self-pay

## 2023-05-13 ENCOUNTER — Emergency Department (HOSPITAL_COMMUNITY)
Admission: EM | Admit: 2023-05-13 | Discharge: 2023-05-13 | Disposition: A | Discharge status: Home / Self Care | Payer: Self-pay | Attending: Emergency Medicine | Admitting: Emergency Medicine

## 2023-05-13 DIAGNOSIS — W098XXA Fall on or from other playground equipment, initial encounter: Secondary | ICD-10-CM | POA: Insufficient documentation

## 2023-05-13 DIAGNOSIS — Y92017 Garden or yard in single-family (private) house as the place of occurrence of the external cause: Secondary | ICD-10-CM | POA: Insufficient documentation

## 2023-05-13 DIAGNOSIS — S93402A Sprain of unspecified ligament of left ankle, initial encounter: Secondary | ICD-10-CM | POA: Insufficient documentation

## 2023-05-13 MED ORDER — ACETAMINOPHEN 500 MG PO TABS
1000.0000 mg | ORAL_TABLET | Freq: Once | ORAL | Status: AC
  Administered 2023-05-13: 1000 mg via ORAL
  Filled 2023-05-13: qty 2

## 2023-05-13 MED ORDER — IBUPROFEN 400 MG PO TABS
400.0000 mg | ORAL_TABLET | Freq: Once | ORAL | Status: AC
  Administered 2023-05-13: 400 mg via ORAL
  Filled 2023-05-13: qty 1

## 2023-05-13 NOTE — ED Provider Notes (Signed)
  [place] EMERGENCY DEPARTMENT AT [facility] Provider Note   CSN: [id number] Arrival date & time: 05/13/23  2116     History Chief Complaint  Patient presents with   Ankle Pain     Ankle Pain Associated symptoms: no back pain and no fever    [name] is a 52 y.o. female presenting for pain on the left ankle after sustaining a fall in her sloped garden. She fell on her leg and felt a pop on her ankle. Pain is 10/10.   Patient's recorded medical, surgical, social, medication list and allergies were reviewed in the Snapshot window as part of the initial history.   Review of Systems   Review of Systems  Constitutional:  Negative for chills and fever.  Respiratory:  Negative for shortness of breath.   Cardiovascular:  Negative for palpitations.  Gastrointestinal:  Negative for abdominal pain.  Musculoskeletal:  Positive for gait problem and joint swelling. Negative for back pain.  Skin:  Negative for pallor and wound.  Neurological:  Negative for light-headedness.    Physical Exam Updated Vital Signs BP 124/82   Pulse 90   Temp 98.7 F (37.1 C)   Resp 16   LMP 11/08/2016   SpO2 95%  Physical Exam Musculoskeletal:     Right ankle: Normal.     Left ankle: Swelling present. No ecchymosis or lacerations. Tenderness present over the lateral malleolus and proximal fibula. Decreased range of motion. Normal pulse.     Left Achilles Tendon: No tenderness.     Left foot: Swelling and bony tenderness present. No foot drop, laceration or crepitus. Normal pulse.    Radiology   Agree with radiology report at this time.   DG Ankle Complete Left  Result Date: 05/13/2023 CLINICAL DATA:  Pain, injury. EXAM: LEFT ANKLE COMPLETE - 3+ VIEW COMPARISON:  None Available. FINDINGS: No evidence of acute fracture. There is a lucency through the dorsal aspect of the navicular that has a chronic appearance. The ankle mortise is preserved, no dislocation. There is  a small ankle joint effusion. Moderate plantar calcaneal spur and diminutive Achilles tendon enthesophyte. IMPRESSION: 1. Lucency through the dorsal aspect of the navicular has a chronic appearance. Recommend correlation with point tenderness. 2. No evidence of acute fracture or subluxation of the left ankle. Electronically Signed   By: Narda Rutherford M.D.   On: 05/13/2023 22:19      ED Course/ Medical Decision Making/ A&P    Medications Ordered in ED Medications  acetaminophen (TYLENOL) tablet 1,000 mg (1,000 mg Oral Given 05/13/23 2259)  ibuprofen (ADVIL) tablet 400 mg (400 mg Oral Given 05/13/23 2259)    Medical Decision Making:    [name] is a 52 y.o. female who presented to the ED with complaints of a fall over her left ankle today. She is unable to bear weight. DP present, leg warm to touch.   Clinical Impression:  1. Sprain of left ankle, unspecified ligament, initial encounter     -Air Cast  -Crutches -Elevate Feet  -Acetaminophen/Ibuprofen for pain.  -Follow up with Sports Medicine if symptoms worsen.  Discharge   Final Clinical Impression(s) / ED Diagnoses Final diagnoses:  Sprain of left ankle, unspecified ligament, initial encounter    Rx / DC Orders ED Discharge Orders     None         Hassan Rowan, [place], MD 05/13/23 4098    Gwyneth Sprout, MD 05/13/23 2310

## 2023-05-13 NOTE — Discharge Instructions (Addendum)
Tome ibupropheno (motrin/advil) cada 6 horas como indicado en la botella que consigue en las farmacias en venta libre.  Tome tylenol/paracetamol cada 8 horas como inidicado en la botella que consigue en las botellas en las farmacias en venta [name]. Max dosis 3,000 mg.  Eleve su pie cuando pueda  Use su yeso y muletas.

## 2023-05-13 NOTE — Progress Notes (Signed)
Orthopedic Tech Progress Note Patient Details:  [name] [facility] 08/20/1971 [id number]  Ortho Devices Type of Ortho Device: Ankle Air splint, Crutches Ortho Device/Splint Location: LLE Ortho Device/Splint Interventions: Ordered, Application, Adjustment   Post Interventions Patient Tolerated: Well Instructions Provided: Adjustment of device, Care of device, Poper ambulation with device  [name] L [name] 05/13/2023, 11:24 PM

## 2023-05-13 NOTE — ED Triage Notes (Signed)
Patient slipped and injured her left ankle this afternoon . Pain increases with movement .

## 2023-08-12 ENCOUNTER — Ambulatory Visit: Payer: Self-pay | Admitting: Nurse Practitioner

## 2023-09-02 ENCOUNTER — Other Ambulatory Visit: Payer: Self-pay

## 2023-09-02 ENCOUNTER — Encounter: Payer: Self-pay | Admitting: Nurse Practitioner

## 2023-09-02 ENCOUNTER — Ambulatory Visit: Payer: Self-pay | Attending: Nurse Practitioner | Admitting: Nurse Practitioner

## 2023-09-02 VITALS — BP 133/88 | HR 89 | Ht 61.0 in | Wt 188.0 lb

## 2023-09-02 DIAGNOSIS — I10 Essential (primary) hypertension: Secondary | ICD-10-CM

## 2023-09-02 DIAGNOSIS — R7303 Prediabetes: Secondary | ICD-10-CM

## 2023-09-02 LAB — POCT GLYCOSYLATED HEMOGLOBIN (HGB A1C): HbA1c, POC (prediabetic range): 6.4 % (ref 5.7–6.4)

## 2023-09-02 MED ORDER — INFLUENZA VIRUS VACC SPLIT PF (FLUZONE) 0.5 ML IM SUSY
0.5000 mL | PREFILLED_SYRINGE | Freq: Once | INTRAMUSCULAR | 0 refills | Status: AC
  Filled 2023-09-02: qty 0.5, 1d supply, fill #0

## 2023-09-02 NOTE — Progress Notes (Signed)
Assessment & Plan:  [name] was seen today for medical management of chronic issues.  Diagnoses and all orders for this visit:  Prediabetes Well-controlled at this time. -     POCT glycosylated hemoglobin (Hb A1C) CMP pending.     Primary hypertension Continue amlodipine as prescribed Reminded to bring in blood pressure log for follow  up appointment.  RECOMMENDATIONS: DASH/Mediterranean Diets are healthier choices for HTN.     Patient has been counseled on age-appropriate routine health concerns for screening and prevention. These are reviewed and up-to-date. Referrals have been placed accordingly. Immunizations are up-to-date or declined.    Subjective:   Chief Complaint  Patient presents with   Medical Management of Chronic Issues    [name] 52 y.o. female presents to office today for   VRI was used to communicate directly with patient for the entire encounter including providing detailed patient instructions.    She has a past medical history of GERD, Hypertension, prediabetes and Inverted nipple.   HPI  HTN Blood pressure is slightly elevated. She states she did not take her amlodipine 5 mg yet today.   BP Readings from Last 3 Encounters:  09/02/23 133/88  05/13/23 124/82  02/09/23 138/78     Prediabetes Currently controlled with dietary management only at this time.  Weight is up 4 pounds since last visit Lab Results  Component Value Date   HGBA1C 6.4 09/02/2023    Lab Results  Component Value Date   HGBA1C 6.4 (H) 01/03/2023      Review of Systems  Constitutional:  Negative for fever, malaise/fatigue and weight loss.  HENT: Negative.  Negative for nosebleeds.   Eyes: Negative.  Negative for blurred vision, double vision and photophobia.  Respiratory: Negative.  Negative for cough and shortness of breath.   Cardiovascular: Negative.  Negative for chest pain, palpitations and leg swelling.  Gastrointestinal: Negative.   Negative for heartburn, nausea and vomiting.  Musculoskeletal: Negative.  Negative for myalgias.  Neurological: Negative.  Negative for dizziness, focal weakness, seizures and headaches.  Psychiatric/Behavioral: Negative.  Negative for suicidal ideas.     Past Medical History:  Diagnosis Date   GERD (gastroesophageal reflux disease)    Hypertension    2011   Inverted nipple    bilateral    Past Surgical History:  Procedure Laterality Date   CESAREAN SECTION     2011   ECTOPIC PREGNANCY SURGERY  1994    TUBAL LIGATION     2011    Family History  Problem Relation Age of Onset   Diabetes Mother    Hypertension Mother    Cancer Neg Hx    Heart disease Neg Hx    Kidney disease Neg Hx    Depression Neg Hx    Breast cancer Neg Hx     Social History Reviewed with no changes to be made today.   Outpatient Medications Prior to Visit  Medication Sig Dispense Refill   amLODipine (NORVASC) 5 MG tablet Take 1 tablet (5 mg total) by mouth daily. 90 tablet 1   atorvastatin (LIPITOR) 20 MG tablet Take 1 tablet (20 mg total) by mouth daily. 90 tablet 3   famotidine (PEPCID) 40 MG tablet Take 1 tablet (40 mg total) by mouth daily. 90 tablet 3   hydrOXYzine (VISTARIL) 25 MG capsule Take 1 capsule (25 mg total) by mouth 3 (three) times daily as needed for itching. FOR ITCHING 60 capsule 3   ibuprofen (ADVIL) 600 MG tablet Take  1 tablet (600 mg total) by mouth every 8 (eight) hours as needed. 60 tablet 1   mometasone (ELOCON) 0.1 % cream Apply 1 Application topically daily. 60 g 1   No facility-administered medications prior to visit.    No Known Allergies     Objective:    BP 133/88 (BP Location: Left Arm, Patient Position: Sitting, Cuff Size: Normal)   Pulse 89   Ht 5\' 1"  (1.549 m)   Wt 188 lb (85.3 kg)   LMP 11/08/2016   SpO2 96%   BMI 35.52 kg/m  Wt Readings from Last 3 Encounters:  09/02/23 188 lb (85.3 kg)  02/09/23 184 lb 3.2 oz (83.6 kg)  02/03/23 183 lb (83 kg)     Physical Exam Vitals and nursing note reviewed.  Constitutional:      Appearance: She is well-developed.  HENT:     Head: Normocephalic and atraumatic.  Cardiovascular:     Rate and Rhythm: Normal rate and regular rhythm.     Heart sounds: Normal heart sounds. No murmur heard.    No friction rub. No gallop.  Pulmonary:     Effort: Pulmonary effort is normal. No tachypnea or respiratory distress.     Breath sounds: Normal breath sounds. No decreased breath sounds, wheezing, rhonchi or rales.  Chest:     Chest wall: No tenderness.  Abdominal:     General: Bowel sounds are normal.     Palpations: Abdomen is soft.  Musculoskeletal:        General: Normal range of motion.     Cervical back: Normal range of motion.  Skin:    General: Skin is warm and dry.  Neurological:     Mental Status: She is alert and oriented to person, place, and time.     Coordination: Coordination normal.  Psychiatric:        Behavior: Behavior normal. Behavior is cooperative.        Thought Content: Thought content normal.        Judgment: Judgment normal.          Patient has been counseled extensively about nutrition and exercise as well as the importance of adherence with medications and regular follow-up. The patient was given clear instructions to go to ER or return to medical center if symptoms don't improve, worsen or new problems develop. The patient verbalized understanding.   Follow-up: Return in about 6 months (around 03/02/2024).   Claiborne Rigg, FNP-BC [facility] and [facility]ness [place], [place] [phone]   09/02/2023, 2:13 PM

## 2023-09-03 LAB — CMP14+EGFR
ALT: 36 [IU]/L — ABNORMAL HIGH (ref 0–32)
AST: 30 [IU]/L (ref 0–40)
Albumin: 4.5 g/dL (ref 3.8–4.9)
Alkaline Phosphatase: 150 [IU]/L — ABNORMAL HIGH (ref 44–121)
BUN/Creatinine Ratio: 17 (ref 9–23)
BUN: 11 mg/dL (ref 6–24)
Bilirubin Total: 0.2 mg/dL (ref 0.0–1.2)
CO2: 25 mmol/L (ref 20–29)
Calcium: 9.4 mg/dL (ref 8.7–10.2)
Chloride: 102 mmol/L (ref 96–106)
Creatinine, Ser: 0.63 mg/dL (ref 0.57–1.00)
Globulin, Total: 3.4 g/dL (ref 1.5–4.5)
Glucose: 160 mg/dL — ABNORMAL HIGH (ref 70–99)
Potassium: 4.4 mmol/L (ref 3.5–5.2)
Sodium: 139 mmol/L (ref 134–144)
Total Protein: 7.9 g/dL (ref 6.0–8.5)
eGFR: 107 mL/min/{1.73_m2} (ref >59)

## 2023-09-13 ENCOUNTER — Other Ambulatory Visit: Payer: Self-pay

## 2023-10-19 IMAGING — US US ABDOMEN LIMITED
1 series · 14 of 25 positions shown · non-contrast
Comparison: None Available.

CLINICAL DATA: Upper abdominal pain beginning yesterday.

EXAM:
ULTRASOUND ABDOMEN LIMITED RIGHT UPPER QUADRANT

[Series 1: us abdomen limited · 14 of 38 slices shown]
[im 1/38]
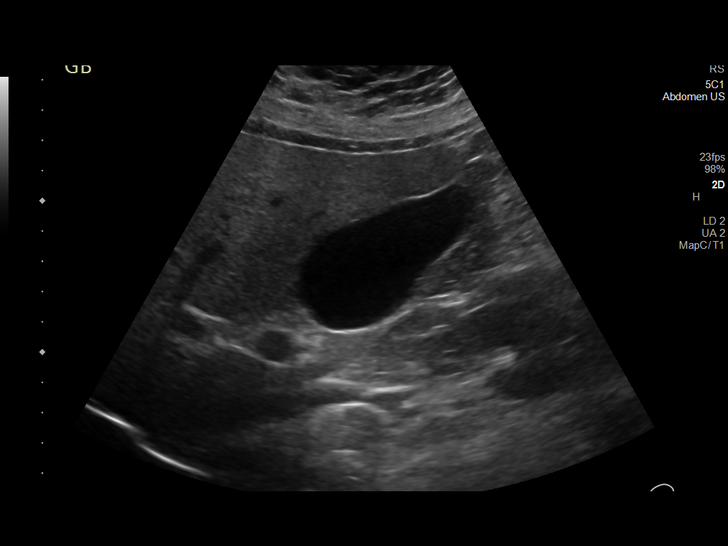
[im 4/38]
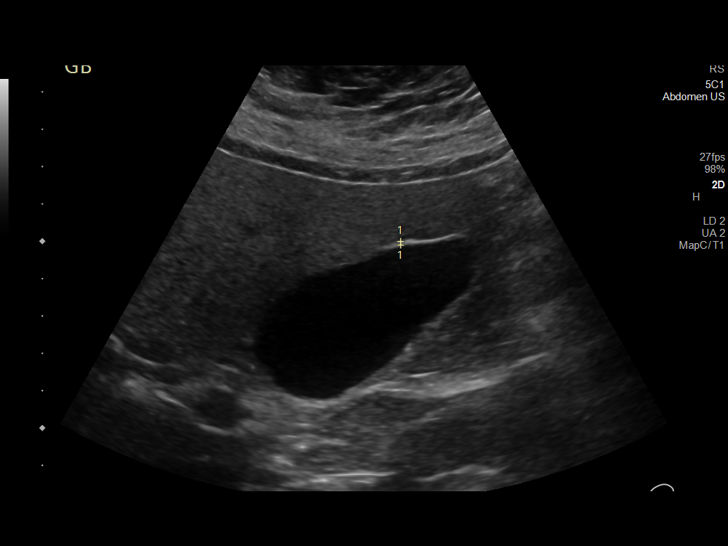
[im 7/38]
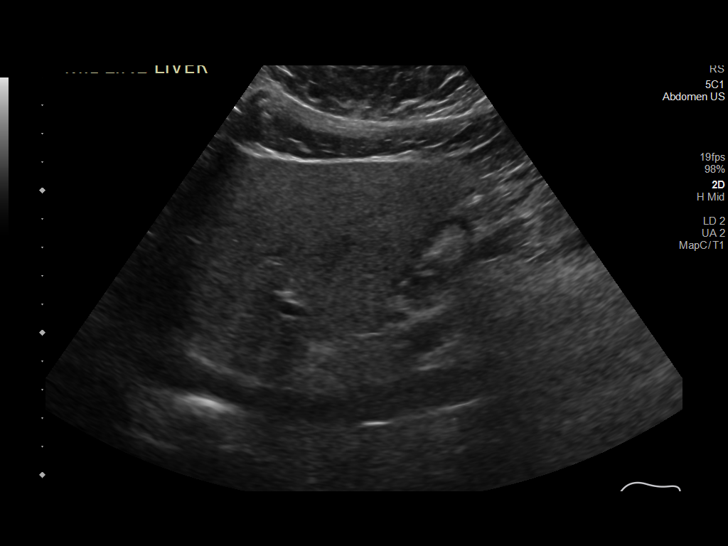
[im 10/38]
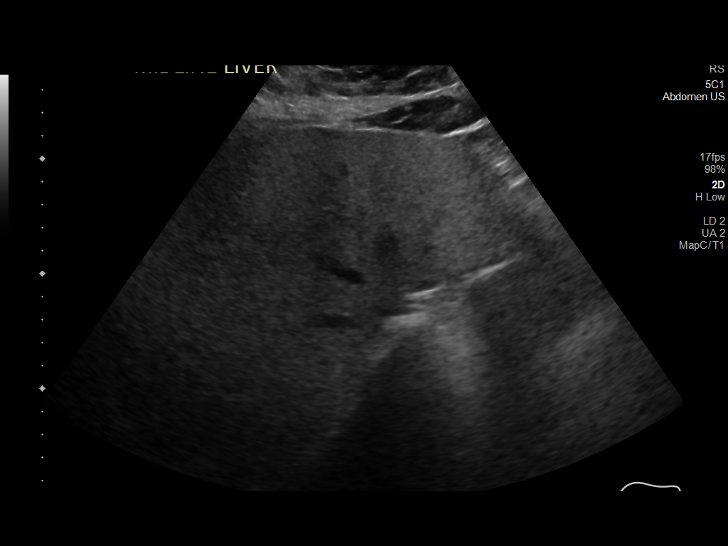
[im 13/38]
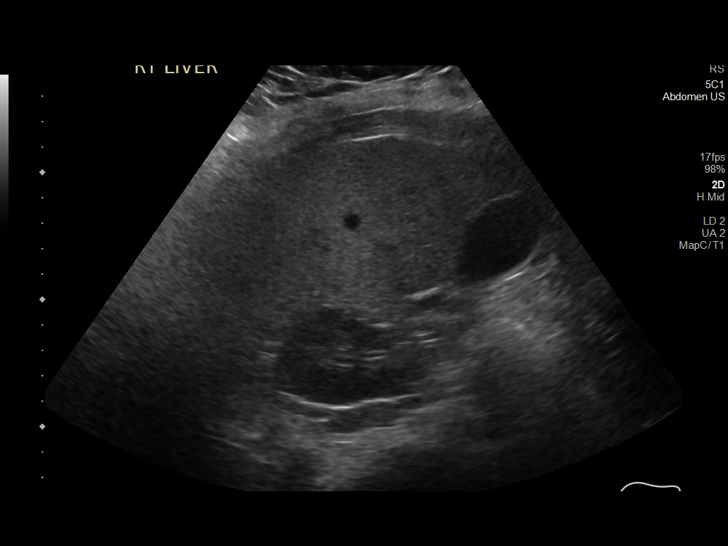
[im 14/38]
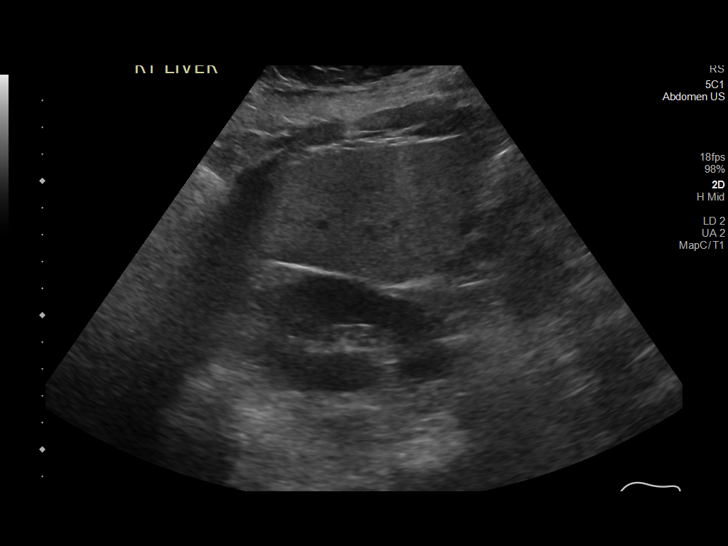
[im 17/38]
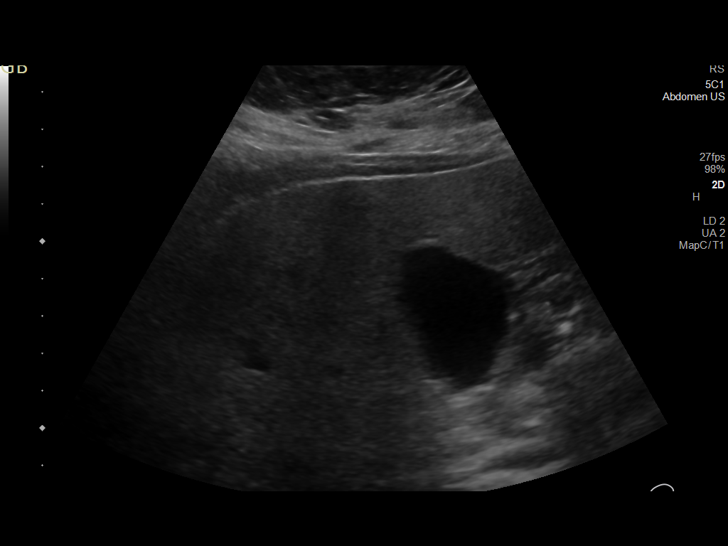
[im 21/38]
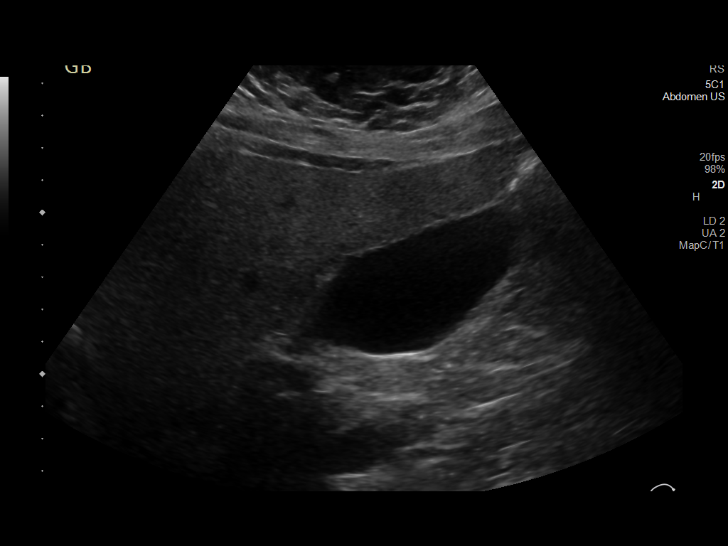
[im 24/38]
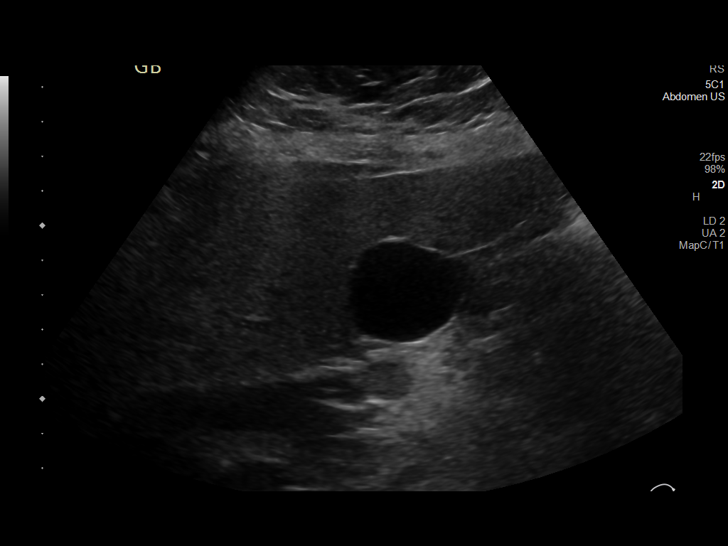
[im 25/38]
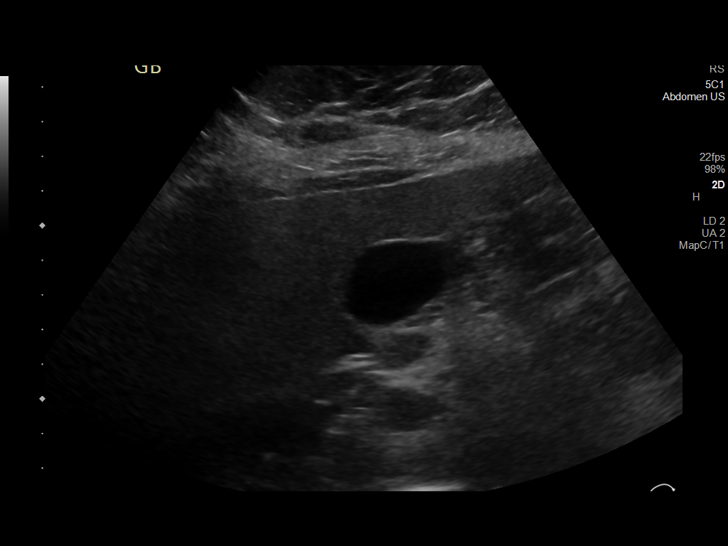
[im 28/38]
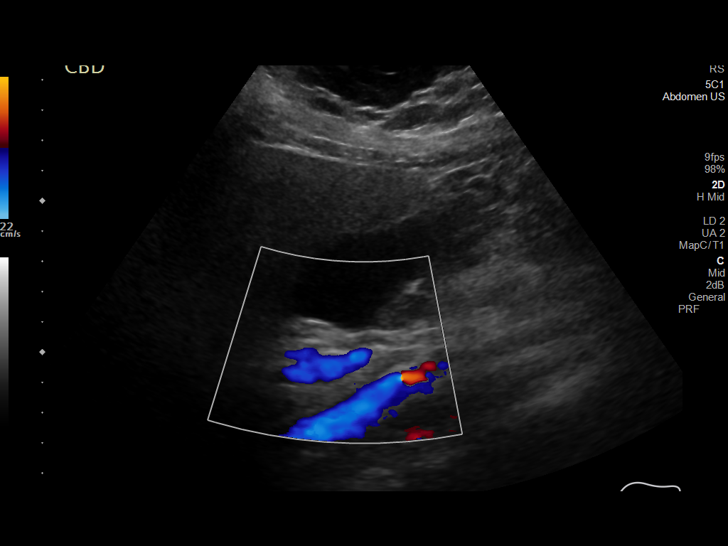
[im 31/38]
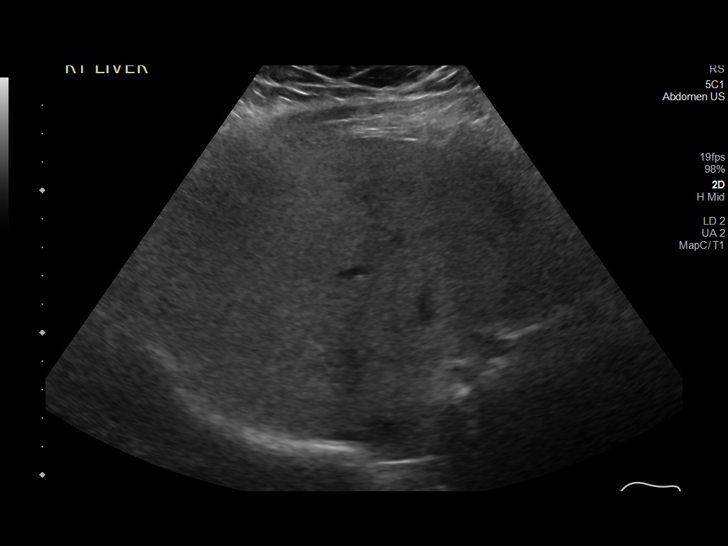
[im 34/38]
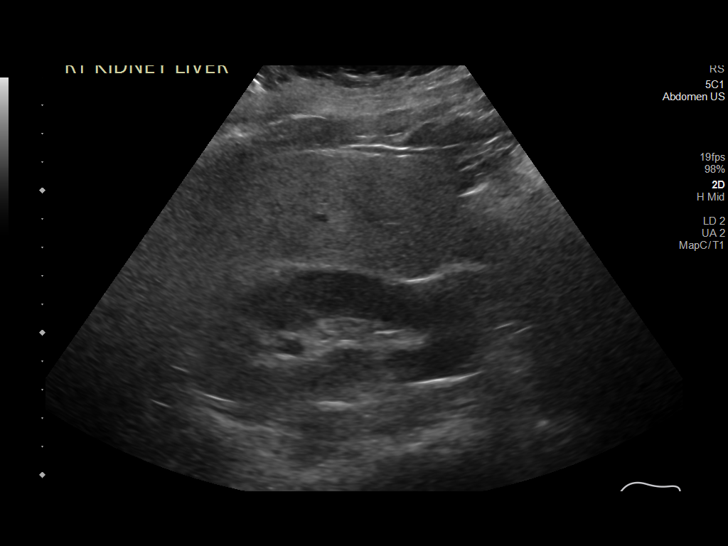
[im 38/38]
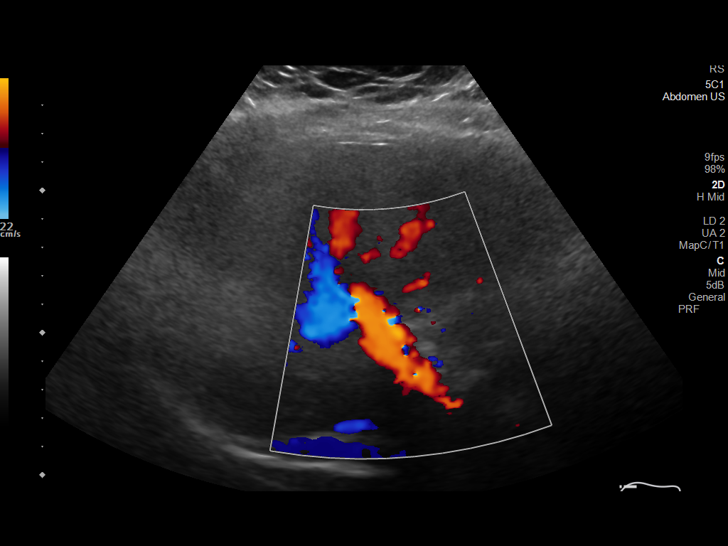

[14 of 25 positions shown; findings below may reference images not displayed]

FINDINGS: Gallbladder:

No gallstones or wall thickening visualized. No sonographic Murphy
sign noted by sonographer.

Common bile duct:

Diameter: 3 mm, within normal limits.

Liver:

Diffusely increased echogenicity of the hepatic parenchyma,
consistent with hepatic steatosis. No hepatic mass identified.
Portal vein is patent on color Doppler imaging with normal direction
of blood flow towards the liver.

Other: None.
IMPRESSION: No evidence of cholelithiasis or biliary ductal dilatation.

Diffuse hepatic steatosis.

## 2023-12-05 ENCOUNTER — Other Ambulatory Visit: Payer: Self-pay

## 2024-01-10 ENCOUNTER — Encounter: Payer: Self-pay | Admitting: Nurse Practitioner

## 2024-01-10 ENCOUNTER — Ambulatory Visit: Payer: Self-pay | Attending: Nurse Practitioner | Admitting: Nurse Practitioner

## 2024-01-10 ENCOUNTER — Other Ambulatory Visit: Payer: Self-pay

## 2024-01-10 VITALS — BP 131/84 | HR 66 | Wt 189.6 lb

## 2024-01-10 DIAGNOSIS — I10 Essential (primary) hypertension: Secondary | ICD-10-CM

## 2024-01-10 DIAGNOSIS — E7800 Pure hypercholesterolemia, unspecified: Secondary | ICD-10-CM

## 2024-01-10 DIAGNOSIS — M546 Pain in thoracic spine: Secondary | ICD-10-CM

## 2024-01-10 DIAGNOSIS — Z1211 Encounter for screening for malignant neoplasm of colon: Secondary | ICD-10-CM

## 2024-01-10 DIAGNOSIS — R7303 Prediabetes: Secondary | ICD-10-CM

## 2024-01-10 DIAGNOSIS — Z1231 Encounter for screening mammogram for malignant neoplasm of breast: Secondary | ICD-10-CM

## 2024-01-10 DIAGNOSIS — K219 Gastro-esophageal reflux disease without esophagitis: Secondary | ICD-10-CM

## 2024-01-10 MED ORDER — IBUPROFEN 600 MG PO TABS
600.0000 mg | ORAL_TABLET | Freq: Three times a day (TID) | ORAL | 1 refills | Status: DC | PRN
  Filled 2024-01-10: qty 60, 20d supply, fill #0

## 2024-01-10 MED ORDER — METHOCARBAMOL 500 MG PO TABS
500.0000 mg | ORAL_TABLET | Freq: Four times a day (QID) | ORAL | 1 refills | Status: DC
  Filled 2024-01-10: qty 60, 15d supply, fill #0

## 2024-01-10 MED ORDER — FAMOTIDINE 40 MG PO TABS
40.0000 mg | ORAL_TABLET | Freq: Every day | ORAL | 3 refills | Status: DC
  Filled 2024-01-10: qty 90, 90d supply, fill #0

## 2024-01-10 MED ORDER — AMLODIPINE BESYLATE 5 MG PO TABS
5.0000 mg | ORAL_TABLET | Freq: Every day | ORAL | 1 refills | Status: DC
  Filled 2024-01-10: qty 90, 90d supply, fill #0

## 2024-01-10 NOTE — Progress Notes (Signed)
 Assessment & Plan:  [name] was seen today for medical management of chronic issues.  Diagnoses and all orders for this visit:  Essential hypertension -     CMP14+EGFR -     amLODipine (NORVASC) 5 MG tablet; Take 1 tablet (5 mg total) by mouth daily. Continue all antihypertensives as prescribed.  Reminded to bring in blood pressure log for follow  up appointment.  RECOMMENDATIONS: DASH/Mediterranean Diets are healthier choices for HTN.    Acute midline thoracic back pain -     DG Chest 2 View; Future -     ibuprofen (ADVIL) 600 MG tablet; Take 1 tablet (600 mg total) by mouth every 8 (eight) hours as needed. -     methocarbamol (ROBAXIN) 500 MG tablet; Take 1 tablet (500 mg total) by mouth 4 (four) times daily. Muscle relaxant  Hypercholesterolemia -     Lipid panel INSTRUCTIONS: Work on a low fat, heart healthy diet and participate in regular aerobic exercise program by working out at least 150 minutes per week; 5 days a week-30 minutes per day. Avoid red meat/beef/steak,  fried foods. junk foods, sodas, sugary drinks, unhealthy snacking, alcohol and smoking.  Drink at least 80 oz of water per day and monitor your carbohydrate intake daily.    Prediabetes -     Hemoglobin A1c  GERD without esophagitis -     famotidine (PEPCID) 40 MG tablet; Take 1 tablet (40 mg total) by mouth daily. INSTRUCTIONS: Avoid GERD Triggers: acidic, spicy or fried foods, caffeine, coffee, sodas,  alcohol and chocolate.    Breast cancer screening by mammogram -     MS 3D SCR MAMMO BILAT BR (aka MM); Future  Colon cancer screening -     Fecal occult blood, imunochemical([facility])    Patient has been counseled on age-appropriate routine health concerns for screening and prevention. These are reviewed and up-to-date. Referrals have been placed accordingly. Immunizations are up-to-date or declined.    Subjective:   Chief Complaint  Patient presents with   Medical Management of Chronic Issues     Patient states that she is having back pain. Also she stated that wen she gets worried her psoriasis flares up( current flare up on stomach)    [name] 53 y.o. female presents to office today for follow up to HTN and back pain.   HTN Blood pressure is well controlled. She is taking amlodipine 5 mg daily as prescribed.  BP Readings from Last 3 Encounters:  01/10/24 131/84  09/02/23 133/88  05/13/23 124/82     Her psoriasis is flaring whenever she gets anxious or stressed. She is taking elcocon as prescbied.    Last night she felt pain in the back of her lungs and also notes back pain when she takes a deep breath or moves. Today she denies cough or chest pain. She also feels sore from exercising last week when she was twisting, bending and performing upper body exercises. She did take ibuprofen 600 mg and pain was slightly relieved.      Review of Systems  Constitutional:  Negative for fever, malaise/fatigue and weight loss.  HENT: Negative.  Negative for nosebleeds.   Eyes: Negative.  Negative for blurred vision, double vision and photophobia.  Respiratory:  Positive for shortness of breath. Negative for cough.   Cardiovascular: Negative.  Negative for chest pain, palpitations and leg swelling.  Gastrointestinal: Negative.  Negative for heartburn, nausea and vomiting.  Musculoskeletal:  Positive for back pain. Negative for myalgias.  Neurological: Negative.  Negative for dizziness, focal weakness, seizures and headaches.  Psychiatric/Behavioral: Negative.  Negative for suicidal ideas.     Past Medical History:  Diagnosis Date   GERD (gastroesophageal reflux disease)    Hypertension    2011   Inverted nipple    bilateral    Past Surgical History:  Procedure Laterality Date   CESAREAN SECTION     2011   ECTOPIC PREGNANCY SURGERY  1994    TUBAL LIGATION     2011    Family History  Problem Relation Age of Onset   Diabetes Mother     Hypertension Mother    Cancer Neg Hx    Heart disease Neg Hx    Kidney disease Neg Hx    Depression Neg Hx    Breast cancer Neg Hx     Social History Reviewed with no changes to be made today.   Outpatient Medications Prior to Visit  Medication Sig Dispense Refill   atorvastatin (LIPITOR) 20 MG tablet Take 1 tablet (20 mg total) by mouth daily. 90 tablet 3   hydrOXYzine (VISTARIL) 25 MG capsule Take 1 capsule (25 mg total) by mouth 3 (three) times daily as needed for itching. FOR ITCHING 60 capsule 3   mometasone (ELOCON) 0.1 % cream Apply 1 Application topically daily. 60 g 1   amLODipine (NORVASC) 5 MG tablet Take 1 tablet (5 mg total) by mouth daily. 90 tablet 1   famotidine (PEPCID) 40 MG tablet Take 1 tablet (40 mg total) by mouth daily. 90 tablet 3   ibuprofen (ADVIL) 600 MG tablet Take 1 tablet (600 mg total) by mouth every 8 (eight) hours as needed. 60 tablet 1   No facility-administered medications prior to visit.    No Known Allergies     Objective:    BP 131/84 (BP Location: Left Arm, Patient Position: Sitting, Cuff Size: Normal)   Pulse 66   Wt 189 lb 9.6 oz (86 kg)   LMP 11/08/2016   SpO2 98%   BMI 35.82 kg/m  Wt Readings from Last 3 Encounters:  01/10/24 189 lb 9.6 oz (86 kg)  09/02/23 188 lb (85.3 kg)  02/09/23 184 lb 3.2 oz (83.6 kg)    Physical Exam Vitals and nursing note reviewed.  Constitutional:      Appearance: She is well-developed.  HENT:     Head: Normocephalic and atraumatic.  Cardiovascular:     Rate and Rhythm: Normal rate and regular rhythm.     Heart sounds: Normal heart sounds. No murmur heard.    No friction rub. No gallop.  Pulmonary:     Effort: Pulmonary effort is normal. No tachypnea or respiratory distress.     Breath sounds: Normal breath sounds. No decreased breath sounds, wheezing, rhonchi or rales.  Chest:     Chest wall: No tenderness.  Abdominal:     General: Bowel sounds are normal.     Palpations: Abdomen is soft.   Musculoskeletal:        General: Normal range of motion.     Cervical back: Normal range of motion.  Skin:    General: Skin is warm and dry.  Neurological:     Mental Status: She is alert and oriented to person, place, and time.     Coordination: Coordination normal.  Psychiatric:        Behavior: Behavior normal. Behavior is cooperative.        Thought Content: Thought content normal.  Judgment: Judgment normal.          Patient has been counseled extensively about nutrition and exercise as well as the importance of adherence with medications and regular follow-up. The patient was given clear instructions to go to ER or return to medical center if symptoms don't improve, worsen or new problems develop. The patient verbalized understanding.   Follow-up: Return in about 3 months (around 04/11/2024).   Claiborne Rigg, FNP-BC [facility] and Wellness [place], [place] [phone]   01/10/2024, 2:59 PM

## 2024-01-11 ENCOUNTER — Encounter: Payer: Self-pay | Admitting: Nurse Practitioner

## 2024-01-11 LAB — LIPID PANEL
Chol/HDL Ratio: 4 ratio (ref 0.0–4.4)
Cholesterol, Total: 166 mg/dL (ref 100–199)
HDL: 41 mg/dL (ref >39)
LDL Chol Calc (NIH): 97 mg/dL (ref 0–99)
Triglycerides: 162 mg/dL — ABNORMAL HIGH (ref 0–149)
VLDL Cholesterol Cal: 28 mg/dL (ref 5–40)

## 2024-01-11 LAB — CMP14+EGFR
ALT: 34 IU/L — ABNORMAL HIGH (ref 0–32)
AST: 35 IU/L (ref 0–40)
Albumin: 4.5 g/dL (ref 3.8–4.9)
Alkaline Phosphatase: 149 IU/L — ABNORMAL HIGH (ref 44–121)
BUN/Creatinine Ratio: 13 (ref 9–23)
BUN: 9 mg/dL (ref 6–24)
Bilirubin Total: 0.3 mg/dL (ref 0.0–1.2)
CO2: 24 mmol/L (ref 20–29)
Calcium: 9.8 mg/dL (ref 8.7–10.2)
Chloride: 103 mmol/L (ref 96–106)
Creatinine, Ser: 0.68 mg/dL (ref 0.57–1.00)
Globulin, Total: 3.7 g/dL (ref 1.5–4.5)
Glucose: 95 mg/dL (ref 70–99)
Potassium: 5.2 mmol/L (ref 3.5–5.2)
Sodium: 141 mmol/L (ref 134–144)
Total Protein: 8.2 g/dL (ref 6.0–8.5)
eGFR: 105 mL/min/{1.73_m2} (ref >59)

## 2024-01-11 LAB — HEMOGLOBIN A1C
Est. average glucose Bld gHb Est-mCnc: 148 mg/dL
Hgb A1c MFr Bld: 6.8 % — ABNORMAL HIGH (ref 4.8–5.6)

## 2024-01-15 LAB — FECAL OCCULT BLOOD, IMMUNOCHEMICAL: Fecal Occult Bld: NEGATIVE

## 2024-01-17 ENCOUNTER — Encounter: Payer: Self-pay | Admitting: Nurse Practitioner

## 2024-02-21 ENCOUNTER — Inpatient Hospital Stay: Admission: RE | Admit: 2024-02-21 | Payer: Self-pay | Source: Ambulatory Visit

## 2024-03-16 ENCOUNTER — Ambulatory Visit: Payer: Self-pay | Admitting: Nurse Practitioner

## 2024-03-22 ENCOUNTER — Ambulatory Visit
Admission: RE | Admit: 2024-03-22 | Discharge: 2024-03-22 | Disposition: A | Discharge status: Home / Self Care | Payer: Self-pay | Source: Ambulatory Visit | Attending: Nurse Practitioner

## 2024-03-22 DIAGNOSIS — Z1231 Encounter for screening mammogram for malignant neoplasm of breast: Secondary | ICD-10-CM

## 2024-03-29 ENCOUNTER — Ambulatory Visit: Payer: Self-pay | Admitting: Nurse Practitioner

## 2024-04-13 ENCOUNTER — Other Ambulatory Visit: Payer: Self-pay

## 2024-04-13 ENCOUNTER — Ambulatory Visit: Payer: Self-pay | Attending: Nurse Practitioner | Admitting: Physician Assistant

## 2024-04-13 VITALS — BP 118/80 | HR 92 | Ht 61.0 in | Wt 191.6 lb

## 2024-04-13 DIAGNOSIS — E785 Hyperlipidemia, unspecified: Secondary | ICD-10-CM

## 2024-04-13 DIAGNOSIS — Z603 Acculturation difficulty: Secondary | ICD-10-CM

## 2024-04-13 DIAGNOSIS — I10 Essential (primary) hypertension: Secondary | ICD-10-CM

## 2024-04-13 DIAGNOSIS — K648 Other hemorrhoids: Secondary | ICD-10-CM

## 2024-04-13 DIAGNOSIS — Z758 Other problems related to medical facilities and other health care: Secondary | ICD-10-CM

## 2024-04-13 DIAGNOSIS — E1165 Type 2 diabetes mellitus with hyperglycemia: Secondary | ICD-10-CM

## 2024-04-13 DIAGNOSIS — K219 Gastro-esophageal reflux disease without esophagitis: Secondary | ICD-10-CM

## 2024-04-13 DIAGNOSIS — L400 Psoriasis vulgaris: Secondary | ICD-10-CM

## 2024-04-13 LAB — POCT GLYCOSYLATED HEMOGLOBIN (HGB A1C): HbA1c, POC (controlled diabetic range): 6.5 % (ref 0.0–7.0)

## 2024-04-13 MED ORDER — ATORVASTATIN CALCIUM 20 MG PO TABS
20.0000 mg | ORAL_TABLET | Freq: Every day | ORAL | 1 refills | Status: AC
  Filled 2024-04-13: qty 90, 90d supply, fill #0
  Filled 2024-12-07 (×2): qty 90, 90d supply, fill #1

## 2024-04-13 MED ORDER — FAMOTIDINE 40 MG PO TABS
40.0000 mg | ORAL_TABLET | Freq: Every day | ORAL | 1 refills | Status: AC
  Filled 2024-04-13: qty 90, 90d supply, fill #0
  Filled 2024-12-07: qty 90, 90d supply, fill #1

## 2024-04-13 MED ORDER — HYDROXYZINE PAMOATE 25 MG PO CAPS
25.0000 mg | ORAL_CAPSULE | Freq: Three times a day (TID) | ORAL | 2 refills | Status: AC | PRN
  Filled 2024-04-13: qty 60, 20d supply, fill #0
  Filled 2024-08-24: qty 60, 20d supply, fill #1
  Filled 2024-12-07 (×2): qty 60, 20d supply, fill #2

## 2024-04-13 MED ORDER — AMLODIPINE BESYLATE 5 MG PO TABS
5.0000 mg | ORAL_TABLET | Freq: Every day | ORAL | 1 refills | Status: AC
  Filled 2024-04-13: qty 90, 90d supply, fill #0
  Filled 2024-12-07 (×2): qty 90, 90d supply, fill #1

## 2024-04-13 NOTE — Progress Notes (Signed)
 Patient ID: [name], female   DOB: 1971-08-22, 53 y.o.   MRN: [id number]   [name], is a 53 y.o. female  ZDG:[id number]  [id number]  DOB - January 10, 1971  Chief Complaint  Patient presents with   Medical Management of Chronic Issues    No concerns       Subjective:   [name] is a 53 y.o. female here today for med RF and would like a referral for hemorrhoids.  She was not aware that she has diabetes but A1c has improved since last visit.  she wants to work on diet and exercise to see if she can reverse it.  She also wants to go over last lab work, last mammo(03/2024 and normal), and last pap (11/2021 and normal.   No problems updated.  ALLERGIES: No Known Allergies  PAST MEDICAL HISTORY: Past Medical History:  Diagnosis Date   GERD (gastroesophageal reflux disease)    Hypertension    2011   Inverted nipple    bilateral    MEDICATIONS AT HOME: Prior to Admission medications   Medication Sig Start Date End Date Taking? Authorizing Provider  ibuprofen  (ADVIL ) 600 MG tablet Take 1 tablet (600 mg total) by mouth every 8 (eight) hours as needed. 01/10/24  Yes Fleming, Zelda W, NP  methocarbamol  (ROBAXIN ) 500 MG tablet Take 1 tablet (500 mg total) by mouth 4 (four) times daily. Muscle relaxant 01/10/24  Yes Fleming, Zelda W, NP  mometasone  (ELOCON ) 0.1 % cream Apply 1 Application topically daily. 02/09/23  Yes Fleming, Zelda W, NP  amLODipine  (NORVASC ) 5 MG tablet Take 1 tablet (5 mg total) by mouth daily. 04/13/24   Hassie Lint, PA-C  atorvastatin  (LIPITOR) 20 MG tablet Take 1 tablet (20 mg total) by mouth daily. 04/13/24   Hassie Lint, PA-C  famotidine  (PEPCID ) 40 MG tablet Take 1 tablet (40 mg total) by mouth daily. 04/13/24   Hassie Lint, PA-C  hydrOXYzine  (VISTARIL ) 25 MG capsule Take 1 capsule (25 mg total) by mouth 3 (three) times daily as needed for itching. FOR ITCHING 04/13/24   Henri Guedes, Stan Eans, PA-C    ROS: Neg  HEENT Neg resp Neg cardiac Neg GU Neg MS Neg psych Neg neuro  Objective:   Vitals:   04/13/24 1117  BP: 118/80  Pulse: 92  SpO2: 97%  Weight: 191 lb 9.6 oz (86.9 kg)  Height: 5\' 1"  (1.549 m)   Exam General appearance : Awake, alert, not in any distress. Speech Clear. Not toxic looking HEENT: Atraumatic and Normocephalic Neck: Supple, no JVD. No cervical lymphadenopathy.  Chest: Good air entry bilaterally, CTAB.  No rales/rhonchi/wheezing CVS: S1 S2 regular, no murmurs.  Extremities: B/L Lower Ext shows no edema, both legs are warm to touch Neurology: Awake alert, and oriented X 3, CN II-XII intact, Non focal  Data Review Lab Results  Component Value Date   HGBA1C 6.5 04/13/2024   HGBA1C 6.8 (H) 01/10/2024   HGBA1C 6.4 09/02/2023    Assessment & Plan   1. Type 2 diabetes mellitus with hyperglycemia, unspecified whether long term insulin use (HCC) (Primary) Improved. She does not want to start metformin.  Instructions in Spanish given Work at a goal of eliminating sugary drinks, candy, desserts, sweets, refined sugars, processed foods, and white carbohydrates. - POCT glycosylated hemoglobin (Hb A1C)  2. Essential hypertension - amLODipine  (NORVASC ) 5 MG tablet; Take 1 tablet (5 mg total) by mouth daily.  Dispense: 90 tablet; Refill: 1  3. Psoriasis vulgaris -  hydrOXYzine  (VISTARIL ) 25 MG capsule; Take 1 capsule (25 mg total) by mouth 3 (three) times daily as needed for itching. FOR ITCHING  Dispense: 60 capsule; Refill: 2  4. Dyslipidemia, goal LDL below 100 - atorvastatin  (LIPITOR) 20 MG tablet; Take 1 tablet (20 mg total) by mouth daily.  Dispense: 90 tablet; Refill: 1  5. GERD without esophagitis - famotidine  (PEPCID ) 40 MG tablet; Take 1 tablet (40 mg total) by mouth daily.  Dispense: 90 tablet; Refill: 1  6. Other hemorrhoids - Ambulatory referral to General Surgery  7. Language barrier [employer] interpreters "Billie Budge" used and additional time performing visit was  required.     Return in about 4 months (around 08/13/2024) for PCP for chronic conditions.  The patient was given clear instructions to go to ER or return to medical center if symptoms don't improve, worsen or new problems develop. The patient verbalized understanding. The patient was told to call to get lab results if they haven't heard anything in the next week.      Dulce Gibbs, PA-C [facility] and [facility] [place], [place] [phone]   04/13/2024, 11:53 AM

## 2024-04-13 NOTE — Patient Instructions (Addendum)
   Ponte como meta eliminar las bebidas azucaradas, los Muse, los postres, los [place], los azcares refinados, los alimentos procesados y los carbohidratos blancos.  Bebe de 1,8 a 2,2 litros de Scientist, research (medical).  Aade ejercicio a diario. Incluso una caminata de 15 minutos al da [employer].

## 2024-07-18 ENCOUNTER — Other Ambulatory Visit: Payer: Self-pay

## 2024-07-18 MED ORDER — HYDROCORTISONE ACETATE 25 MG RE SUPP
RECTAL | 0 refills | Status: AC
  Filled 2024-07-18: qty 20, 10d supply, fill #0

## 2024-08-17 ENCOUNTER — Ambulatory Visit: Payer: Self-pay | Admitting: Nurse Practitioner

## 2024-08-24 ENCOUNTER — Other Ambulatory Visit: Payer: Self-pay

## 2024-08-24 ENCOUNTER — Ambulatory Visit: Payer: Self-pay | Attending: Nurse Practitioner | Admitting: Nurse Practitioner

## 2024-08-24 ENCOUNTER — Encounter: Payer: Self-pay | Admitting: Nurse Practitioner

## 2024-08-24 VITALS — BP 128/78 | HR 79 | Resp 19 | Ht 61.0 in | Wt 190.2 lb

## 2024-08-24 DIAGNOSIS — Z23 Encounter for immunization: Secondary | ICD-10-CM

## 2024-08-24 DIAGNOSIS — Z79899 Other long term (current) drug therapy: Secondary | ICD-10-CM

## 2024-08-24 DIAGNOSIS — R7989 Other specified abnormal findings of blood chemistry: Secondary | ICD-10-CM

## 2024-08-24 DIAGNOSIS — L409 Psoriasis, unspecified: Secondary | ICD-10-CM

## 2024-08-24 DIAGNOSIS — E1165 Type 2 diabetes mellitus with hyperglycemia: Secondary | ICD-10-CM

## 2024-08-24 MED ORDER — CLOBETASOL PROPIONATE 0.05 % EX OINT
1.0000 | TOPICAL_OINTMENT | Freq: Two times a day (BID) | CUTANEOUS | 1 refills | Status: AC
  Filled 2024-08-24: qty 60, 30d supply, fill #0
  Filled 2024-12-07: qty 60, 30d supply, fill #1

## 2024-08-24 NOTE — Progress Notes (Signed)
 Assessment & Plan:  [name] was seen today for hypertension.  Diagnoses and all orders for this visit:  Psoriasis -     Ambulatory referral to Dermatology -     clobetasol ointment (TEMOVATE) 0.05 %; Apply 1 Application topically 2 (two) times daily. Psoriasis with intermittent flares on stomach, shoulders, and elbows. - Prescribed stronger topical cream. - Referred to dermatology for further evaluation.  Type 2 diabetes mellitus with hyperglycemia, unspecified whether long term insulin use (HCC) -     Urine Albumin/Creatinine with ratio (send out) [LAB689]  Need for influenza vaccination -     Flu vaccine trivalent PF, 6mos and older(Flulaval ,Afluria,Fluarix,Fluzone)  Elevated LFTs -     CMP14+EGFR    Patient has been counseled on age-appropriate routine health concerns for screening and prevention. These are reviewed and up-to-date. Referrals have been placed accordingly. Immunizations are up-to-date or declined.    Subjective:   Chief Complaint  Patient presents with   Hypertension   History of Present Illness [name] is a 53 year old female who presents for follow-up on hemorrhoids and psoriasis management.  She is currently being followed by general surgery for her hemorrhoids and has an upcoming appointment scheduled for August 29, 2024. She was prescribed medication for ten days and is awaiting her next appointment for further evaluation.  Her psoriasis is sometimes under control but occasionally flares up, affecting the skin on her stomach, shoulders, and elbows. She is seeking a referral to a dermatologist for further management.    Review of Systems  Constitutional:  Negative for fever, malaise/fatigue and weight loss.  Respiratory: Negative.  Negative for cough and shortness of breath.   Cardiovascular: Negative.  Negative for chest pain, palpitations and leg swelling.  Gastrointestinal: Negative.  Negative for heartburn, nausea and  vomiting.  Genitourinary:        SEE HPI  Skin:  Positive for itching and rash.  Neurological: Negative.  Negative for dizziness, focal weakness, seizures and headaches.  Psychiatric/Behavioral: Negative.  Negative for suicidal ideas.     Past Medical History:  Diagnosis Date   GERD (gastroesophageal reflux disease)    Hypertension    2011   Inverted nipple    bilateral    Past Surgical History:  Procedure Laterality Date   CESAREAN SECTION     2011   ECTOPIC PREGNANCY SURGERY  1994    TUBAL LIGATION     2011    Family History  Problem Relation Age of Onset   Diabetes Mother    Hypertension Mother    Cancer Neg Hx    Heart disease Neg Hx    Kidney disease Neg Hx    Depression Neg Hx    Breast cancer Neg Hx     Social History Reviewed with no changes to be made today.   Outpatient Medications Prior to Visit  Medication Sig Dispense Refill   amLODipine  (NORVASC ) 5 MG tablet Take 1 tablet (5 mg total) by mouth daily. 90 tablet 1   atorvastatin  (LIPITOR) 20 MG tablet Take 1 tablet (20 mg total) by mouth daily. 90 tablet 1   famotidine  (PEPCID ) 40 MG tablet Take 1 tablet (40 mg total) by mouth daily. 90 tablet 1   hydrocortisone  (ANUSOL -HC) 25 MG suppository Place 1 suppository (25 mg total) rectally 2 (two) times daily for 10 days 20 suppository 0   hydrOXYzine  (VISTARIL ) 25 MG capsule Take 1 capsule (25 mg total) by mouth 3 (three) times daily as needed for  itching. FOR ITCHING 60 capsule 2   ibuprofen  (ADVIL ) 600 MG tablet Take 1 tablet (600 mg total) by mouth every 8 (eight) hours as needed. (Patient not taking: Reported on 08/24/2024) 60 tablet 1   methocarbamol  (ROBAXIN ) 500 MG tablet Take 1 tablet (500 mg total) by mouth 4 (four) times daily. Muscle relaxant (Patient not taking: Reported on 08/24/2024) 60 tablet 1   mometasone  (ELOCON ) 0.1 % cream Apply 1 Application topically daily. (Patient not taking: Reported on 08/24/2024) 60 g 1   No facility-administered  medications prior to visit.    No Known Allergies     Objective:    BP 128/78 (BP Location: Left Arm, Patient Position: Sitting, Cuff Size: Normal)   Pulse 79   Resp 19   Ht 5' 1 (1.549 m)   Wt 190 lb 3.2 oz (86.3 kg)   LMP 11/08/2016   SpO2 99%   BMI 35.94 kg/m  Wt Readings from Last 3 Encounters:  08/24/24 190 lb 3.2 oz (86.3 kg)  04/13/24 191 lb 9.6 oz (86.9 kg)  01/10/24 189 lb 9.6 oz (86 kg)    Physical Exam Vitals and nursing note reviewed.  Constitutional:      Appearance: She is well-developed.  HENT:     Head: Normocephalic and atraumatic.  Cardiovascular:     Rate and Rhythm: Normal rate and regular rhythm.     Heart sounds: Normal heart sounds. No murmur heard.    No friction rub. No gallop.  Pulmonary:     Effort: Pulmonary effort is normal. No tachypnea or respiratory distress.     Breath sounds: Normal breath sounds. No decreased breath sounds, wheezing, rhonchi or rales.  Chest:     Chest wall: No tenderness.  Musculoskeletal:        General: Normal range of motion.     Cervical back: Normal range of motion.  Skin:    General: Skin is warm and dry.  Neurological:     Mental Status: She is alert and oriented to person, place, and time.     Coordination: Coordination normal.  Psychiatric:        Behavior: Behavior normal. Behavior is cooperative.        Thought Content: Thought content normal.        Judgment: Judgment normal.          Patient has been counseled extensively about nutrition and exercise as well as the importance of adherence with medications and regular follow-up. The patient was given clear instructions to go to ER or return to medical center if symptoms don't improve, worsen or new problems develop. The patient verbalized understanding.   Follow-up: Return in about 4 months (around 12/25/2024).   Haze LELON Servant, FNP-BC [facility] and [facility] [place], [place] [phone]   09/09/2024, 11:06 AM

## 2024-08-24 NOTE — Progress Notes (Signed)
 Interpreter ID# [id number].

## 2024-08-26 ENCOUNTER — Ambulatory Visit: Payer: Self-pay | Admitting: Nurse Practitioner

## 2024-08-26 LAB — CMP14+EGFR
ALT: 35 IU/L — ABNORMAL HIGH (ref 0–32)
AST: 30 IU/L (ref 0–40)
Albumin: 4.5 g/dL (ref 3.8–4.9)
Alkaline Phosphatase: 142 IU/L — ABNORMAL HIGH (ref 49–135)
BUN/Creatinine Ratio: 19 (ref 9–23)
BUN: 12 mg/dL (ref 6–24)
Bilirubin Total: 0.3 mg/dL (ref 0.0–1.2)
CO2: 24 mmol/L (ref 20–29)
Calcium: 9.5 mg/dL (ref 8.7–10.2)
Chloride: 103 mmol/L (ref 96–106)
Creatinine, Ser: 0.63 mg/dL (ref 0.57–1.00)
Globulin, Total: 3.6 g/dL (ref 1.5–4.5)
Glucose: 106 mg/dL — ABNORMAL HIGH (ref 70–99)
Potassium: 4.5 mmol/L (ref 3.5–5.2)
Sodium: 142 mmol/L (ref 134–144)
Total Protein: 8.1 g/dL (ref 6.0–8.5)
eGFR: 106 mL/min/1.73 (ref >59)

## 2024-08-26 LAB — MICROALBUMIN / CREATININE URINE RATIO
Creatinine, Urine: 54.3 mg/dL
Microalb/Creat Ratio: <6 mg/g{creat} (ref 0–29)
Microalbumin, Urine: <3 ug/mL

## 2024-08-30 ENCOUNTER — Ambulatory Visit: Payer: Self-pay

## 2024-08-30 NOTE — Telephone Encounter (Signed)
 FYI Only or Action Required?: Action required by provider: Information regarding medications.  Patient was last seen in primary care on 08/24/2024 by Theotis Haze ORN, NP.  Called Nurse Triage reporting Results.   Triage Disposition: Call PCP When Office is Open  Patient/caregiver understands and will follow disposition?: Yes     Copied from CRM #[id number]. Topic: Clinical - Red Word Triage >> Aug 30, 2024 11:51 AM Jasmin G wrote: [facility] that prompted transfer to Nurse Triage: Pt called regarding recent missed call from clinic with a message to call back, I contacted [facility] and was told to get with NT to relay recent test results. Reason for Disposition  [1] Caller requesting NON-URGENT health information AND [2] PCP's office is the best resource  Answer Assessment - Initial Assessment Questions This RN spoke with the patient using the assistance of interpreter ID: [id number].  This RN read  Normal kidney function Liver enzymes continue slightly elevated. Make sure your diet is low fat low cholesterol. Try to make sure you are walking at least 3-4 days per week for exercise. Urine does not show any microscopic diabetic kidney changes . Patient verbalized understanding and is requesting information regarding medications that can help with getting her results in a normal range.    1. REASON FOR CALL: What is the main reason for your call? or How can I best help you?     Missed called regarding results  2. SYMPTOMS : Do you have any symptoms?      Denies  3. OTHER QUESTIONS: Do you have any other questions?     Yes, patient requesting additional information regarding her results.  Protocols used: Information Only Call - No Triage-A-AH

## 2024-08-30 NOTE — Telephone Encounter (Signed)
 Noted

## 2024-12-07 ENCOUNTER — Other Ambulatory Visit: Payer: Self-pay

## 2024-12-28 ENCOUNTER — Ambulatory Visit: Payer: Self-pay | Admitting: Nurse Practitioner

## 2025-08-05 ENCOUNTER — Ambulatory Visit: Payer: Self-pay | Admitting: Physician Assistant
